# Patient Record
Sex: Male | Born: 1950
Health system: Southern US, Community
[De-identification: ages and names within clinical notes are randomized; demographics above are authoritative.]

## PROBLEM LIST (undated history)

## (undated) DIAGNOSIS — R7303 Prediabetes: Secondary | ICD-10-CM

## (undated) DIAGNOSIS — E7800 Pure hypercholesterolemia, unspecified: Secondary | ICD-10-CM

## (undated) DIAGNOSIS — E875 Hyperkalemia: Secondary | ICD-10-CM

## (undated) DIAGNOSIS — M722 Plantar fascial fibromatosis: Secondary | ICD-10-CM

## (undated) DIAGNOSIS — R972 Elevated prostate specific antigen [PSA]: Secondary | ICD-10-CM

## (undated) DIAGNOSIS — Z86010 Personal history of colonic polyps: Secondary | ICD-10-CM

## (undated) DIAGNOSIS — K222 Esophageal obstruction: Secondary | ICD-10-CM

## (undated) DIAGNOSIS — K5190 Ulcerative colitis, unspecified, without complications: Secondary | ICD-10-CM

## (undated) DIAGNOSIS — K449 Diaphragmatic hernia without obstruction or gangrene: Secondary | ICD-10-CM

## (undated) DIAGNOSIS — K529 Noninfective gastroenteritis and colitis, unspecified: Secondary | ICD-10-CM

## (undated) DIAGNOSIS — K5730 Diverticulosis of large intestine without perforation or abscess without bleeding: Secondary | ICD-10-CM

## (undated) DIAGNOSIS — R7301 Impaired fasting glucose: Secondary | ICD-10-CM

## (undated) DIAGNOSIS — K219 Gastro-esophageal reflux disease without esophagitis: Secondary | ICD-10-CM

## (undated) DIAGNOSIS — K5520 Angiodysplasia of colon without hemorrhage: Secondary | ICD-10-CM

## (undated) DIAGNOSIS — I10 Essential (primary) hypertension: Secondary | ICD-10-CM

## (undated) HISTORY — DX: Pure hypercholesterolemia, unspecified: E78.00

## (undated) HISTORY — DX: Personal history of colonic polyps: Z86.010

## (undated) HISTORY — PX: COLONOSCOPY: SHX174

## (undated) HISTORY — DX: Hyperkalemia: E87.5

## (undated) HISTORY — DX: Ulcerative colitis, unspecified, without complications: K51.90

## (undated) HISTORY — DX: Esophageal obstruction: K22.2

## (undated) HISTORY — DX: Impaired fasting glucose: R73.01

## (undated) HISTORY — DX: Diaphragmatic hernia without obstruction or gangrene: K44.9

## (undated) HISTORY — DX: Essential (primary) hypertension: I10

## (undated) HISTORY — DX: Noninfective gastroenteritis and colitis, unspecified: K52.9

## (undated) HISTORY — DX: Elevated prostate specific antigen (PSA): R97.20

## (undated) HISTORY — PX: VASECTOMY: SHX75

## (undated) HISTORY — PX: OTHER SURGICAL HISTORY: SHX169

## (undated) HISTORY — DX: Gastro-esophageal reflux disease without esophagitis: K21.9

## (undated) HISTORY — DX: Prediabetes: R73.03

## (undated) HISTORY — DX: Angiodysplasia of colon without hemorrhage: K55.20

## (undated) HISTORY — DX: Plantar fascial fibromatosis: M72.2

## (undated) HISTORY — PX: UPPER GASTROINTESTINAL ENDOSCOPY: SHX188

## (undated) HISTORY — DX: Diverticulosis of large intestine without perforation or abscess without bleeding: K57.30

---

## 1997-03-28 ENCOUNTER — Encounter: Payer: Self-pay | Admitting: Internal Medicine

## 2010-03-05 ENCOUNTER — Encounter (INDEPENDENT_AMBULATORY_CARE_PROVIDER_SITE_OTHER): Payer: Self-pay | Admitting: *Deleted

## 2010-03-05 ENCOUNTER — Ambulatory Visit
Admission: RE | Admit: 2010-03-05 | Discharge: 2010-03-05 | Payer: Self-pay | Source: Home / Self Care | Attending: Internal Medicine | Admitting: Internal Medicine

## 2010-03-05 DIAGNOSIS — E785 Hyperlipidemia, unspecified: Secondary | ICD-10-CM | POA: Insufficient documentation

## 2010-03-05 DIAGNOSIS — K219 Gastro-esophageal reflux disease without esophagitis: Secondary | ICD-10-CM | POA: Insufficient documentation

## 2010-03-05 DIAGNOSIS — K625 Hemorrhage of anus and rectum: Secondary | ICD-10-CM | POA: Insufficient documentation

## 2010-03-05 DIAGNOSIS — R1032 Left lower quadrant pain: Secondary | ICD-10-CM | POA: Insufficient documentation

## 2010-03-07 ENCOUNTER — Ambulatory Visit
Admission: RE | Admit: 2010-03-07 | Discharge: 2010-03-07 | Payer: Self-pay | Source: Home / Self Care | Attending: Gastroenterology | Admitting: Gastroenterology

## 2010-03-07 ENCOUNTER — Other Ambulatory Visit: Payer: Self-pay | Admitting: Gastroenterology

## 2010-03-07 LAB — CBC WITH DIFFERENTIAL/PLATELET
Basophils Absolute: 0 10*3/uL (ref 0.0–0.1)
Basophils Relative: 0.7 % (ref 0.0–3.0)
Eosinophils Absolute: 0.2 10*3/uL (ref 0.0–0.7)
Eosinophils Relative: 3.2 % (ref 0.0–5.0)
HCT: 42.8 % (ref 39.0–52.0)
Hemoglobin: 14.7 g/dL (ref 13.0–17.0)
Lymphocytes Relative: 35 % (ref 12.0–46.0)
Lymphs Abs: 2.3 10*3/uL (ref 0.7–4.0)
MCHC: 34.3 g/dL (ref 30.0–36.0)
MCV: 91 fl (ref 78.0–100.0)
Monocytes Absolute: 0.8 10*3/uL (ref 0.1–1.0)
Monocytes Relative: 11.6 % (ref 3.0–12.0)
Neutro Abs: 3.3 10*3/uL (ref 1.4–7.7)
Neutrophils Relative %: 49.5 % (ref 43.0–77.0)
Platelets: 324 10*3/uL (ref 150.0–400.0)
RBC: 4.7 Mil/uL (ref 4.22–5.81)
RDW: 13.8 % (ref 11.5–14.6)
WBC: 6.7 10*3/uL (ref 4.5–10.5)

## 2010-03-07 LAB — HEPATIC FUNCTION PANEL
ALT: 27 U/L (ref 0–53)
AST: 24 U/L (ref 0–37)
Albumin: 3.9 g/dL (ref 3.5–5.2)
Alkaline Phosphatase: 59 U/L (ref 39–117)
Bilirubin, Direct: 0.1 mg/dL (ref 0.0–0.3)
Total Bilirubin: 0.7 mg/dL (ref 0.3–1.2)
Total Protein: 7.3 g/dL (ref 6.0–8.3)

## 2010-03-07 LAB — BASIC METABOLIC PANEL
BUN: 15 mg/dL (ref 6–23)
CO2: 28 mEq/L (ref 19–32)
Calcium: 9.3 mg/dL (ref 8.4–10.5)
Chloride: 104 mEq/L (ref 96–112)
Creatinine, Ser: 0.9 mg/dL (ref 0.4–1.5)
GFR: 91.29 mL/min (ref >60.00)
Glucose, Bld: 91 mg/dL (ref 70–99)
Potassium: 4.5 mEq/L (ref 3.5–5.1)
Sodium: 141 mEq/L (ref 135–145)

## 2010-03-07 LAB — IBC PANEL
Iron: 106 ug/dL (ref 42–165)
Saturation Ratios: 29.6 % (ref 20.0–50.0)
Transferrin: 256.2 mg/dL (ref 212.0–360.0)

## 2010-03-07 LAB — HIGH SENSITIVITY CRP: CRP, High Sensitivity: 21.17 mg/L — ABNORMAL HIGH (ref 0.00–5.00)

## 2010-03-07 LAB — IRON: Iron: 106 ug/dL (ref 42–165)

## 2010-03-07 LAB — FERRITIN: Ferritin: 290.7 ng/mL (ref 22.0–322.0)

## 2010-03-07 LAB — FOLATE: Folate: 16.7 ng/mL

## 2010-03-07 LAB — VITAMIN B12: Vitamin B-12: 1093 pg/mL — ABNORMAL HIGH (ref 211–911)

## 2010-03-07 LAB — SEDIMENTATION RATE: Sed Rate: 47 mm/hr — ABNORMAL HIGH (ref 0–22)

## 2010-03-07 LAB — TSH: TSH: 1.61 u[IU]/mL (ref 0.35–5.50)

## 2010-03-18 ENCOUNTER — Ambulatory Visit
Admission: RE | Admit: 2010-03-18 | Discharge: 2010-03-18 | Payer: Self-pay | Source: Home / Self Care | Attending: Gastroenterology | Admitting: Gastroenterology

## 2010-03-18 ENCOUNTER — Encounter (INDEPENDENT_AMBULATORY_CARE_PROVIDER_SITE_OTHER): Payer: Self-pay | Admitting: *Deleted

## 2010-03-18 DIAGNOSIS — K5732 Diverticulitis of large intestine without perforation or abscess without bleeding: Secondary | ICD-10-CM | POA: Insufficient documentation

## 2010-03-18 DIAGNOSIS — K5730 Diverticulosis of large intestine without perforation or abscess without bleeding: Secondary | ICD-10-CM | POA: Insufficient documentation

## 2010-03-19 ENCOUNTER — Telehealth: Payer: Self-pay | Admitting: Gastroenterology

## 2010-04-03 NOTE — Assessment & Plan Note (Signed)
Summary: f/u diverticulitis/dsn   History of Present Illness Visit Type: Follow-up Visit Primary GI MD: Verl Blalock MD [place] Primary Provider: Unice Cobble, MD Requesting Provider: Unice Cobble, MD  Chief Complaint: Patient here for f/u after diverticulitis flare. He states that he is doing better. He does have some increased stomach "growling." He denies any abdominal pain or change in bowels at this time. History of Present Illness:   Stephen Bryant is 100% better after being treated for acute diverticulitis. He has had no further rectal bleeding. All labs were normal assessment elevated sed rate and CRP consistent with an acute inflammatory reaction. Other considerations would be resolved ischemic colitis.   GI Review of Systems      Denies abdominal pain, acid reflux, belching, bloating, chest pain, dysphagia with liquids, dysphagia with solids, heartburn, loss of appetite, nausea, vomiting, vomiting blood, weight loss, and  weight gain.      Reports diverticulosis.     Denies anal fissure, black tarry stools, change in bowel habit, constipation, diarrhea, fecal incontinence, heme positive stool, hemorrhoids, irritable bowel syndrome, jaundice, light color stool, liver problems, rectal bleeding, and  rectal pain. Preventive Screening-Counseling & Management  Caffeine-Diet-Exercise     Does Patient Exercise: no      Drug Use:  no.      Current Medications (verified): 1)  None  Allergies (verified): No Known Drug Allergies  Past History:  Past medical, surgical, family and social histories (including risk factors) reviewed for relevance to current acute and chronic problems.  Past Medical History: Microscopic Hematuria 1999 Hyperlipidemia : 1999 LDL 196, HDL 56, TC 285, TG 167. Framingham Study LDL goal = < 130 RECTAL BLEEDING (ICD-569.3) ABDOMINAL PAIN, LEFT LOWER QUADRANT (ICD-789.04) GERD (ICD-530.81) Diverticulosis Diverticulitis  Past Surgical  History: Reviewed history from 03/05/2010 and no changes required. Denies surgical history Colonoscopy : NONE  ( [place] reviewed)  Family History: Reviewed history from 03/07/2010 and no changes required. Father::heart disease,MI @ 69, DM Mother: deceased of stomach cancer Siblings: 1 brother: COPD, 1 sister: CAD, MI @ 73 No FH of Colon Cancer:  Social History: Reviewed history from 03/07/2010 and no changes required. Occupation: Armed forces technical officer Married Childern Former Smoker: quit 2005 Alcohol use-yes: 2 glasses of wine once daily or every other day  Daily Caffeine Use: 3 daily  Illicit Drug Use - no Patient does not get regular exercise.  Drug Use:  no Does Patient Exercise:  no  Review of Systems       The patient complains of hearing problems.  The patient denies allergy/sinus, anemia, anxiety-new, arthritis/joint pain, back pain, blood in urine, breast changes/lumps, change in vision, confusion, cough, coughing up blood, depression-new, fainting, fatigue, fever, headaches-new, heart murmur, heart rhythm changes, itching, menstrual pain, muscle pains/cramps, night sweats, nosebleeds, pregnancy symptoms, shortness of breath, skin rash, sleeping problems, sore throat, swelling of feet/legs, swollen lymph glands, thirst - excessive , urination - excessive , urination changes/pain, urine leakage, vision changes, and voice change.    Vital Signs:  Patient profile:   60 year old male Height:      69.25 inches Weight:      185.25 pounds BMI:     27.26 BSA:     2.01 Pulse rate:   72 / minute Pulse rhythm:   regular BP sitting:   126 / 80  (left arm)  Vitals Entered By: Madlyn Frankel CMA Deborra Medina) (March 18, 2010 8:19 AM)  Physical Exam  General:  Well developed, well nourished, no acute distress.healthy  appearing.   Head:  Normocephalic and atraumatic. Eyes:  PERRLA, no icterus.exam deferred to patient's ophthalmologist.   Abdomen:  Soft, nontender and nondistended. No  masses, hepatosplenomegaly or hernias noted. Normal bowel sounds.No left lower quadrant tenderness or mass noted. Rectal:  deferred until time of colonoscopy.   Psych:  Alert and cooperative. Normal mood and affect.   Impression & Recommendations:  Problem # 1:  DIVERTICULITIS, COLON (ICD-562.11) Assessment Improved Slowly reintroduce fiber into his diet. I also placed him on Florstar daily for several weeks. Colonoscopy scheduled for one months time. Patient education video on diverticular management shown to the patient.  Problem # 2:  RECTAL BLEEDING (ICD-569.3) Assessment: Improved Colonoscopy indicated to occlude ischemic colitis, inflammatory bowel disease, carcinoma etc.  Problem # 3:  GERD (ICD-530.81) Assessment: Improved He denies clinical symptomatology at this time.  Patient Instructions: 1)  Copy sent to :Dr. Unice Cobble 2)  Diverticular Disease brochure given.  3)  Colonoscopy and Flexible Sigmoidoscopy brochure given.  4)  Colonoscopy in one month's time.  Appended Document: Orders Update    Clinical Lists Changes  Medications: Added new medication of MOVIPREP 100 GM  SOLR (PEG-KCL-NACL-NASULF-NA ASC-C) As per prep instructions. - Signed Added new medication of FLORASTOR 250 MG CAPS (SACCHAROMYCES BOULARDII) take one by mouth once daily - Signed Rx of MOVIPREP 100 GM  SOLR (PEG-KCL-NACL-NASULF-NA ASC-C) As per prep instructions.;  #1 x 0;  Signed;  Entered by: Bernita Buffy CMA (AAMA);  Authorized by: Sable Feil MD [facility];  Method used: [phone] [address] [place], [place], [place]  [address], Ph: [phone] or [phone], Fax: [phone] Rx of FLORASTOR 250 MG CAPS (SACCHAROMYCES BOULARDII) take one by mouth once daily;  #30 x 3;  Signed;  Entered by: Bernita Buffy CMA (AAMA);  Authorized by: Sable Feil MD [facility];  Method used: [facility]  [address] [phone] [address] [place], [place], [place]  [address],  Ph: [phone] or [phone], Fax: [phone] Orders: Added new Test order of Colonoscopy (Colon) - Signed Observations: Added new observation of PI GASTRO: Copy sent to : Unice Cobble, MD [facility] Patient Information Guide given to patient.  Colonoscopy and Flexible Sigmoidoscopy brochure given.  Your procedure has been scheduled for 04/16/2010, please follow the seperate instructions.  Your prescription(s) have been sent to you pharmacy.  Today you watched the movie on diverticular disease.  The medication list was reviewed and reconciled.  All changed / newly prescribed medications were explained.  A complete medication list was provided to the patient / caregiver. (03/18/2010 8:40)    Prescriptions: FLORASTOR 250 MG CAPS (SACCHAROMYCES BOULARDII) take one by mouth once daily  #30 x 3   Entered by:   Bernita Buffy CMA ([place])   Authorized by:   Sable Feil MD [facility]   Signed by:   Bernita Buffy CMA ([place]) on 03/18/2010   Method used:   [facility]        [facility]  [phone] (retail)       [place], [place]  [address]       Ph: [phone] or [phone]       Fax: [phone]   RxID:   [id number] MOVIPREP 100 GM  SOLR (PEG-KCL-NACL-NASULF-NA ASC-C) As per prep instructions.  #1 x 0   Entered by:   Bernita Buffy CMA (  AAMA)   Authorized by:   Sable Feil MD [facility]   Signed by:   Bernita Buffy CMA ([place]) on 03/18/2010   Method used:   [facility]        [facility]  [phone][facility] (retail)       [place], [place]  [address]       Ph: [phone] or [phone]       Fax: [phone]   RxID:   [id number]    Patient Instructions: 1)  Copy sent to : Unice Cobble, MD 2)  [facility] Patient Information Guide given to patient.  3)  Colonoscopy and Flexible Sigmoidoscopy brochure given.  4)  Your procedure has been scheduled for 04/16/2010, please follow the  seperate instructions.  5)  Your prescription(s) have been sent to you pharmacy.  6)  Today you watched the movie on diverticular disease.  7)  The medication list was reviewed and reconciled.  All changed / newly prescribed medications were explained.  A complete medication list was provided to the patient / caregiver.

## 2010-04-03 NOTE — Assessment & Plan Note (Signed)
Summary: to reest//abd pain//blood in stool//lch   Vital Signs:  Patient profile:   60 year old male Height:      69.25 inches Weight:      185 pounds BMI:     27.22 Temp:     98.0 degrees F oral Pulse rate:   72 / minute Resp:     14 per minute BP sitting:   122 / 86  (left arm) Cuff size:   large  Vitals Entered By: Georgette Dover CMA (March 05, 2010 2:45 PM) CC: Re-Establish: blood in stool(dark red)  since Monday (off/on) , Abdominal pain, Lipid Management   CC:  Re-Establish: blood in stool(dark red)  since Monday (off/on) , Abdominal pain, and Lipid Management.  History of Present Illness:      This is a 60 year old man who presents with  persistent abdominal pain sonce 03/03/2010 after eating eve meal.  He passed a clot of blood  w/o stool. The patient denies nausea, diarrhea, constipation, melena, anorexia, and hematemesis.  The location of the pain is left lower quadrant.  The pain is described as constant, dull, cramping in quality, and non  radiating .  The patient denies the following symptoms: fever, weight loss, jaundice, and dark urine.  The pain is worse with jarring of the abdomen such as coughing .  The pain  has no relievers.He had a liquid stool with blood 01/03 am. His BM was "normal " today.  Lipid Management History:      Positive NCEP/ATP III risk factors include male age 25 years old or older and family history for ischemic heart disease (females less than 82 years old).  Negative NCEP/ATP III risk factors include non-diabetic, non-tobacco-user status, non-hypertensive, no ASHD (atherosclerotic heart disease), no prior stroke/TIA, no peripheral vascular disease, and no history of aortic aneurysm.     Preventive Screening-Counseling & Management  Alcohol-Tobacco     Smoking Status: quit  Current Medications (verified): 1)  None  Allergies (verified): No Known Drug Allergies  Past History:  Past Medical History: Microscopic Hematuria  1999 GERD Hyperlipidemia : 1999 LDL 196, HDL 56, TC 285, TG 167. Framingham Study LDL goal = < 130.  Past Surgical History: Denies surgical history Colonoscopy : NONE  ( [place] reviewed)  Family History: Father::heart disease,MI @ 79, DM Mother: deceasedof stomach cancer Siblings: 1 brother: COPD, 1 sister: CAD, MI @ 77  Social History: Occupation: Armed forces technical officer Married Former Smoker: quit 2005 Alcohol use-yes: 2 glasses of wine once daily or every other day  Smoking Status:  quit  Review of Systems ENT:  Denies difficulty swallowing, hoarseness, and nosebleeds. Resp:  Denies coughing up blood. GU:  Denies discharge and hematuria. Heme:  Denies abnormal bruising and bleeding.  Physical Exam  General:  well-nourished,in no acute distress; alert,appropriate and cooperative throughout examination Eyes:  No corneal or conjunctival inflammation noted.  Perrla. No icterus Mouth:  Oral mucosa and oropharynx without lesions or exudates.  Teeth in good repair. No pharyngeal erythema.   Lungs:  Normal respiratory effort, chest expands symmetrically. Lungs are clear to auscultation, no crackles or wheezes. Heart:  Normal rate and regular rhythm. S1 and S2 normal without gallop, murmur, click, rub .S4 Abdomen:  Bowel sounds positive,abdomen soft  but slightly tender LQ  without masses, organomegaly or hernias noted. Rectal:  No external abnormalities noted. Normal sphincter tone. No rectal masses or tenderness. FOB negative but no visable stool Prostate:  Prostate gland firm and smooth, no enlargement,  nodularity, tenderness, mass, asymmetry or induration. Skin:  Intact without suspicious lesions or rashes. No jaundice Cervical Nodes:  No lymphadenopathy noted Axillary Nodes:  No palpable lymphadenopathy Psych:  memory intact for recent and remote, normally interactive, and good eye contact.     Impression & Recommendations:  Problem # 1:  ABDOMINAL PAIN, LEFT LOWER QUADRANT  (ICD-789.04)  Orders: Venipuncture (44920) TLB-CBC Platelet - w/Differential (85025-CBCD) Gastroenterology Referral (GI)  Problem # 2:  RECTAL BLEEDING (ICD-569.3)  Orders: Venipuncture (10071) TLB-CBC Platelet - w/Differential (85025-CBCD) Gastroenterology Referral (GI)  Problem # 3:  HYPERLIPIDEMIA (ICD-272.4)  Problem # 4:  GERD (ICD-530.81)  His updated medication list for this problem includes:    Oscimin 0.125 Mg Subl (Hyoscyamine sulfate) .Marland Kitchen... 1 under tongue every 6 hrs as needed for pain  Complete Medication List: 1)  Oscimin 0.125 Mg Subl (Hyoscyamine sulfate) .Marland Kitchen.. 1 under tongue every 6 hrs as needed for pain  Lipid Assessment/Plan:      Based on NCEP/ATP III, the patient's risk factor category is "2 or more risk factors and a calculated 10 year CAD risk of > 20%".  The patient's lipid goals are as follows: Total cholesterol goal is 200; LDL cholesterol goal is 160; HDL cholesterol goal is 40; Triglyceride goal is 150.    Patient Instructions: 1)  Complete stool cards. Once GI issues are addressed ,please have a fasting [place] Heart Panel (1304X) to optimally assess risk of high cholesterol.(Code: 272.4, V17.3) Prescriptions: OSCIMIN 0.125 MG SUBL (HYOSCYAMINE SULFATE) 1 under tongue every 6 hrs as needed for pain  #30 x 0   Entered and Authorized by:   Unice Cobble MD   Signed by:   Unice Cobble MD on 03/05/2010   Method used:   Print then Give to Patient   RxID:   [id number]    Orders Added: 1)  Est. Patient Level IV [15830] 2)  Venipuncture [94076] 3)  TLB-CBC Platelet - w/Differential [85025-CBCD] 4)  Gastroenterology Referral [GI]

## 2010-04-03 NOTE — Progress Notes (Signed)
Summary: triage  Phone Note Call from Patient Call back at [phone]   Caller: Patient Call For: Dr Sharlett Iles Reason for Call: Talk to Nurse Summary of Call: Patient states that he woke up with a lot of pain on his lowr abd, patient was seen yesterday but wants to know what to do. Patient is scheduled fo a procedure 2-15. Initial call taken by: Ronalee Red,  March 19, 2010 10:26 AM  Follow-up for Phone Call        Lmom for patient to call back with s&s. Shella Maxim RN  March 19, 2010 10:45 AM   Patient was seen yesterday and denied abdominal pain. Patient reports he woke up this am to left side pain he rates a 4 on 0-10 scale. Patient took 1 Florastar yesterday. He reports 1 kind of watery BM today- no blood, but he is having "urges" like he needs to go. Shella Maxim RN  March 19, 2010 10:53 AM   Additional Follow-up for Phone Call Additional follow up Details #1::        resume metronidazole 500 mg twice a day and Cipro 500 mg twice a day with p.r.n. Ultram 50 mg every 6-8 hours for 10 days. Also low fiber diet and p.r.n. sublingual Levsin. Additional Follow-up by: Sable Feil MD Philipp Deputy 1:12 PM    Additional Follow-up for Phone Call Additional follow up Details #2::    Notified patient of Dr Buel Ream scripts and the order for a low fiber diet- patient stated understanding. Follow-up by: Shella Maxim RN,  March 19, 2010 2:12 PM  New/Updated Medications: CIPROFLOXACIN HCL 500 MG  TABS (CIPROFLOXACIN HCL) Take 1 twice a day times 10 days METRONIDAZOLE 500 MG  TABS (METRONIDAZOLE) Take 1 twice a day times 10 days TRAMADOL HCL 50 MG TABS (TRAMADOL HCL) 1 by mouth q6-8hr as needed for abdominal pain LEVSIN/SL 0.125 MG  SUBL (HYOSCYAMINE SULFATE) 1 SL as needed for abdominal pain or spasms Prescriptions: LEVSIN/SL 0.125 MG  SUBL (HYOSCYAMINE SULFATE) 1 SL as needed for abdominal pain or spasms  #30 x 0   Entered by:   Shella Maxim  RN   Authorized by:   Sable Feil MD [facility]   Signed by:   Shella Maxim RN on 03/19/2010   Method used:   [facility]        [facility]  [address] [phone][facility] (retail)       [place], [place]  [address]       Ph: [phone] or [phone]       Fax: [phone]   RxID:   [id number] TRAMADOL HCL 50 MG TABS (TRAMADOL HCL) 1 by mouth q6-8hr as needed for abdominal pain  #30 x 0   Entered by:   Shella Maxim RN   Authorized by:   Sable Feil MD [facility]   Signed by:   Shella Maxim RN on 03/19/2010   Method used:   [facility]ronically to        [facility]  [address] [phone]* (retail)       [place], [place]  [address]       Ph: [phone] or [phone]       Fax: [phone]   RxID:   [id number] METRONIDAZOLE 500 MG  TABS (METRONIDAZOLE) Take 1 twice a  day times 10 days  #20 x 0   Entered by:   Shella Maxim RN   Authorized by:   Sable Feil MD [facility]   Signed by:   Shella Maxim RN on 03/19/2010   Method used:   Electronically to        [facility]  [address] [phone]* (retail)       [place], [place]  [address]       Ph: [phone] or [phone]       Fax: [phone]   RxID:   [id number] CIPROFLOXACIN HCL 500 MG  TABS (CIPROFLOXACIN HCL) Take 1 twice a day times 10 days  #20 x 0   Entered by:   Shella Maxim RN   Authorized by:   Sable Feil MD [facility]   Signed by:   Shella Maxim RN on 03/19/2010   Method used:   Electronically to        [facility]  [address] [phone]* (retail)       [place], [place]  [address]       Ph: [phone] or [phone]       Fax: [phone]   RxID:   [id number]

## 2010-04-03 NOTE — Letter (Signed)
Summary: [facility] Instructions  [facility]  [place], [place] [address]   Phone: [phone]  Fax: [phone]       Stephen Bryant    1950-03-21    MRN: [id number]        Procedure Day Sudie Grumbling: Wednesday 04/16/2010     Arrival Time: 9:30am     Procedure Time: 10:30am     Location of Procedure:                    X  [facility] [place] [address] (4th Floor)                        [place]   Starting 5 days prior to your procedure 04/11/2010 do not eat nuts, seeds, popcorn, corn, beans, peas,  salads, or any raw vegetables.  Do not take any fiber supplements (e.g. Metamucil, Citrucel, and Benefiber).  THE DAY BEFORE YOUR PROCEDURE         Tuesday 04/15/2010  1.  Drink clear liquids the entire day-NO SOLID FOOD  2.  Do not drink anything colored red or purple.  Avoid juices with pulp.  No orange juice.  3.  Drink at least 64 oz. (8 glasses) of fluid/clear liquids during the day to prevent dehydration and help the prep work efficiently.  CLEAR LIQUIDS INCLUDE: Water Jello Ice Popsicles Tea (sugar ok, no milk/cream) Powdered fruit flavored drinks Coffee (sugar ok, no milk/cream) Gatorade Juice: apple, white grape, white cranberry  Lemonade Clear bullion, consomm, broth Carbonated beverages (any kind) Strained chicken noodle soup Hard Candy                             4.  In the morning, mix first dose of MoviPrep solution:    Empty 1 Pouch A and 1 Pouch B into the disposable container    Add lukewarm drinking water to the top line of the container. Mix to dissolve    Refrigerate (mixed solution should be used within 24 hrs)  5.  Begin drinking the prep at 5:00 p.m. The MoviPrep container is divided by 4 marks.   Every 15 minutes drink the solution down to the next mark (approximately 8 oz) until the full liter is complete.   6.  Follow completed prep with 16 oz of clear liquid of your choice (Nothing red or  purple).  Continue to drink clear liquids until bedtime.  7.  Before going to bed, mix second dose of MoviPrep solution:    Empty 1 Pouch A and 1 Pouch B into the disposable container    Add lukewarm drinking water to the top line of the container. Mix to dissolve    Refrigerate  THE DAY OF YOUR PROCEDURE      Wednesday 04/16/2010  Beginning at 5:30am (5 hours before procedure):         1. Every 15 minutes, drink the solution down to the next mark (approx 8 oz) until the full liter is complete.  2. Follow completed prep with 16 oz. of clear liquid of your choice.    3. You may drink clear liquids until 8:30am (2 HOURS BEFORE PROCEDURE).   MEDICATION INSTRUCTIONS  Unless otherwise instructed, you should take regular prescription medications with a small sip of water   as early as possible the morning of your procedure.  OTHER INSTRUCTIONS  You will need a responsible adult at least 61 years of age to accompany you and drive you home.   This person must remain in the waiting room during your procedure.  Wear loose fitting clothing that is easily removed.  Leave jewelry and other valuables at home.  However, you may wish to bring a book to read or  an iPod/MP3 player to listen to music as you wait for your procedure to start.  Remove all body piercing jewelry and leave at home.  Total time from sign-in until discharge is approximately 2-3 hours.  You should go home directly after your procedure and rest.  You can resume normal activities the  day after your procedure.  The day of your procedure you should not:   Drive   Make legal decisions   Operate machinery   Drink alcohol   Return to work  You will receive specific instructions about eating, activities and medications before you leave.    The above instructions have been reviewed and explained to me by   _______________________    I fully understand and can verbalize these instructions  _____________________________ Date _________

## 2010-04-03 NOTE — Letter (Signed)
Summary: New Patient letter  [facility]  [address] [place], [place] [address]   Phone: [phone]  Fax: [phone]       03/05/2010 MRN: [id number]  Stephen Bryant [place], [place]  [address]  Dear Mr. LEVI,  Welcome to the Gastroenterology Division at [facility].    You are scheduled to see Dr.  Verl Blalock on March 07, 2010 at 9:00am on the 3rd floor at [facility] [place] [facility].  We ask that you try to arrive at our office 15 minutes prior to your appointment time to allow for check-in.  We would like you to complete the enclosed self-administered evaluation form prior to your visit and bring it with you on the day of your appointment.  We will review it with you.  Also, please bring a complete list of all your medications or, if you prefer, bring the medication bottles and we will list them.  Please bring your insurance card so that we may make a copy of it.  If your insurance requires a referral to see a specialist, please bring your referral form from your primary care physician.  Co-payments are due at the time of your visit and may be paid by cash, check or credit card.     Your office visit will consist of a consult with your physician (includes a physical exam), any laboratory testing he/she may order, scheduling of any necessary diagnostic testing (e.g. x-ray, ultrasound, CT-scan), and scheduling of a procedure (e.g. Endoscopy, Colonoscopy) if required.  Please allow enough time on your schedule to allow for any/all of these possibilities.    If you cannot keep your appointment, please call [phone] to cancel or reschedule prior to your appointment date.  This allows [place] the opportunity to schedule an appointment for another patient in need of care.  If you do not cancel or reschedule by 5 p.m. the business day prior to your appointment date, you will be charged a $50.00 late cancellation/no-show fee.    Thank you  for choosing [facility] for your medical needs.  We appreciate the opportunity to care for you.  Please visit [place] at our website  to learn more about our practice.                     Sincerely,                                                             The Gastroenterology Division

## 2010-04-03 NOTE — Assessment & Plan Note (Signed)
Summary: LLQ ABD PAIN, RECTAL BLEEDING...EM   History of Present Illness Visit Type: Initial Consult Primary GI MD: Verl Blalock MD [place] Primary Provider: Unice Cobble, MD  Requesting Provider: Unice Cobble, MD  Chief Complaint: LLQ abd pain, and pt states after BMs clumps of BRB will come out  History of Present Illness:   Pleasant 60 year old white male referred by Dr. Unice Cobble for evaluation of one week of left lower quadrant discomfort with initial diarrhea and rectal bleeding which seems to have resolved.  Teddrick has no history of chronic medical problems or gastrointestinal issues. 5 days ago he developed sudden left lower quadrant discomfort, tenesmus, and passage of a blood clot with subsequent loose watery nonbloody diarrhea for 24 hours. His stools are now normal but he continues with left lower quadrant discomfort, but no fever, chills, nausea vomiting, upper gastrointestinal, or genitourinary complaints. He had been placed on p.r.n. Levsin which is used on several occasions with mild relief of his spasmodic pain.  2 weeks ago he took an unknown antibiotic that had been prescribed by his daughter for an eye infection. Otherwise, has had no new medications, decongestants, history of cardiovascular or peripheral vascular disease, abdominal trauma, or changes in diet. He currently feels well except the left lower quadrant discomfort which persists. He has not had previous gastrointestinal barium studies or colonoscopy exam.   GI Review of Systems    Reports abdominal pain, acid reflux, and  heartburn.     Location of  Abdominal pain: LLQ.    Denies belching, bloating, chest pain, dysphagia with liquids, dysphagia with solids, loss of appetite, nausea, vomiting, vomiting blood, weight loss, and  weight gain.      Reports rectal bleeding.     Denies anal fissure, black tarry stools, change in bowel habit, constipation, diarrhea, diverticulosis, fecal incontinence, heme  positive stool, hemorrhoids, irritable bowel syndrome, jaundice, light color stool, liver problems, and  rectal pain.    Current Medications (verified): 1)  Oscimin 0.125 Mg Subl (Hyoscyamine Sulfate) .Marland Kitchen.. 1 Under Tongue Every 6 Hrs As Needed For Pain  Allergies (verified): No Known Drug Allergies  Past History:  Past medical, surgical, family and social histories (including risk factors) reviewed for relevance to current acute and chronic problems.  Past Medical History: Microscopic Hematuria 1999 Hyperlipidemia : 1999 LDL 196, HDL 56, TC 285, TG 167. Framingham Study LDL goal = < 130 RECTAL BLEEDING (ICD-569.3) ABDOMINAL PAIN, LEFT LOWER QUADRANT (ICD-789.04) GERD (ICD-530.81)  Past Surgical History: Reviewed history from 03/05/2010 and no changes required. Denies surgical history Colonoscopy : NONE  ( [place] reviewed)  Family History: Reviewed history from 03/05/2010 and no changes required. Father::heart disease,MI @ 21, DM Mother: deceased of stomach cancer Siblings: 1 brother: COPD, 1 sister: CAD, MI @ 19 No FH of Colon Cancer:  Social History: Reviewed history from 03/05/2010 and no changes required. Occupation: Armed forces technical officer Married Childern Former Smoker: quit 2005 Alcohol use-yes: 2 glasses of wine once daily or every other day  Daily Caffeine Use: 3 daily   Review of Systems       The patient complains of hearing problems.  The patient denies allergy/sinus, anemia, anxiety-new, arthritis/joint pain, back pain, blood in urine, breast changes/lumps, change in vision, confusion, cough, coughing up blood, depression-new, fainting, fatigue, fever, headaches-new, heart murmur, heart rhythm changes, itching, menstrual pain, muscle pains/cramps, night sweats, nosebleeds, pregnancy symptoms, shortness of breath, skin rash, sleeping problems, sore throat, swelling of feet/legs, swollen lymph glands, thirst - excessive, urination -  excessive, urination changes/pain, urine  leakage, vision changes, and voice change.    Vital Signs:  Patient profile:   60 year old male Height:      69.25 inches Weight:      185 pounds BMI:     27.22 BSA:     2.01 Pulse rate:   74 / minute Pulse rhythm:   regular BP sitting:   132 / 76  (left arm) Cuff size:   regular  Vitals Entered By: Hope Pigeon CMA (March 07, 2010 8:32 AM)  Physical Exam  General:  Well developed, well nourished, no acute distress.healthy appearing.   Head:  Normocephalic and atraumatic. Eyes:  PERRLA, no icterus.exam deferred to patient's ophthalmologist.   Neck:  Supple; no masses or thyromegaly. Lungs:  Clear throughout to auscultation. Heart:  Regular rate and rhythm; no murmurs, rubs,  or bruits. Abdomen:  No organomegaly noted or evidence of distention. There is mild tenderness in the left lower quadrant of the sigmoid colon without rebound tenderness. Bowel sounds are entirely normal. Rectal:  Normal exam.soft stool present which is guaiac negative. Prostate:  .normal size prostate.   Msk:  Symmetrical with no gross deformities. Normal posture. Pulses:  Normal pulses noted. Extremities:  No clubbing, cyanosis, edema or deformities noted. Neurologic:  Alert and  oriented x4;  grossly normal neurologically. Cervical Nodes:  No significant cervical adenopathy. Psych:  Alert and cooperative. Normal mood and affect.   Impression & Recommendations:  Problem # 1:  ABDOMINAL PAIN, LEFT LOWER QUADRANT (ICD-789.04) Assessment Unchanged Probable subacute diverticulitis. It is also possible that he has had transient ischemic colitis, but he has no real risk factors for this process, use a vasoconstrictive medications, history of cardiovascular or peripheral vascular disease et Ronney Asters. Other considerations would be C. difficile infection related to previous antibiotic use, bacterial enterocolitis, versus idiopathic inflammatory bowel disease. I have decided to place him on metronidazole 500 mg  twice a day, Cipro 500 mg twice a day, p.r.n. Levsin, low fiber diet, and we'll check labs including stool culture and stool C. difficile exam. He is return for followup in 10 days' time. He will need colonoscopy depending on his clinical course. If his symptoms worsen, he is to call us and we'll proceed with abdominal-pelvic CT scan. Orders: TLB-BMP (Basic Metabolic Panel-BMET) ([id number]) TLB-Hepatic/Liver Function Pnl (80076-HEPATIC) TLB-CBC Platelet - w/Differential (85025-CBCD) TLB-TSH (Thyroid Stimulating Hormone) (84443-TSH) TLB-B12, Serum-Total ONLY (74163-A45) TLB-Ferritin (36468-EHO) TLB-Folic Acid (Folate) (12248-GNO) TLB-Iron, (Fe) Total (83540-FE) TLB-IBC Pnl (Iron/FE;Transferrin) (83550-IBC) TLB-CRP-High Sensitivity (C-Reactive Protein) (86140-FCRP) TLB-Sedimentation Rate (ESR) (85652-ESR) T-C diff by PCR (03704)  Problem # 2:  RECTAL BLEEDING (ICD-569.3) Assessment: Improved see above assessment. He will need colonoscopy in the near future. Orders: TLB-BMP (Basic Metabolic Panel-BMET) ([id number]) TLB-Hepatic/Liver Function Pnl (80076-HEPATIC) TLB-CBC Platelet - w/Differential (85025-CBCD) TLB-TSH (Thyroid Stimulating Hormone) (84443-TSH) TLB-B12, Serum-Total ONLY (88280-K34) TLB-Ferritin (91791-TAV) TLB-Folic Acid (Folate) (69794-IAX) TLB-Iron, (Fe) Total (83540-FE) TLB-IBC Pnl (Iron/FE;Transferrin) (83550-IBC) TLB-CRP-High Sensitivity (C-Reactive Protein) (86140-FCRP) TLB-Sedimentation Rate (ESR) (85652-ESR) T-C diff by PCR (65537)  Problem # 3:  GERD (ICD-530.81) Assessment: Improved p.r.n. acid reflux managed with dietary maneuvers.  Patient Instructions: 1)  Your physician requests that you go to the basement floor of our office to have the following labwork completed before leaving today: [place] Panel, Anemia Panel, CRP, Sedimentation Rate, C.Diff 2)  Please pick up your prescriptions at the pharmacy. Electronic prescription(s) has already been sent  for Cipro 500 mg two times a day x 10 days, Metronidazole 500 mg two  times a day x 10 days. 3)  We have given you a low residue/low fiber diet to follow. 4)  We have also given you a handout on Diverticulosis to read over. 5)  You have been scheduled for a follow-up appointment on 03/18/10 @ 8:30 am 6)  Copy sent to : Unice Cobble 7)  The medication list was reviewed and reconciled.  All changed / newly prescribed medications were explained.  A complete medication list was provided to the patient / caregiver. Prescriptions: METRONIDAZOLE 500 MG TABS (METRONIDAZOLE) Take 1 tablet by mouth two times a day x 10 days  #20 x 0   Entered by:   Madlyn Frankel CMA (AAMA)   Authorized by:   Sable Feil MD [facility]   Signed by:   Madlyn Frankel CMA (AAMA) on 03/07/2010   Method used:   [facility]  [address] [phone]ail[facility]       [place], [place]  [address]       Ph: [phone] or [phone]       Fax: [phone]   RxID:   [id number] CIPROFLOXACIN HCL 500 MG TABS (CIPROFLOXACIN HCL) Take 1 tablet by mouth two times a day x 10 days  #20 x 0   Entered by:   Madlyn Frankel CMA ([place])   Authorized by:   Sable Feil MD [facility]   Signed by:   Madlyn Frankel [place] ([place]) on 03/07/2010   Method used:   [facility]        [facility]  [address] [phone][facility] (retail[facility]       [place], [place]  [address]       Ph: [phone] or [phone]       Fax: [phone]   RxID:   [id number]

## 2010-04-16 ENCOUNTER — Other Ambulatory Visit: Payer: Self-pay | Admitting: Gastroenterology

## 2010-04-16 ENCOUNTER — Other Ambulatory Visit (AMBULATORY_SURGERY_CENTER): Payer: BC Managed Care – PPO | Admitting: Gastroenterology

## 2010-04-16 ENCOUNTER — Encounter: Payer: Self-pay | Admitting: Gastroenterology

## 2010-04-16 DIAGNOSIS — Z860100 Personal history of colon polyps, unspecified: Secondary | ICD-10-CM

## 2010-04-16 DIAGNOSIS — R109 Unspecified abdominal pain: Secondary | ICD-10-CM

## 2010-04-16 DIAGNOSIS — D126 Benign neoplasm of colon, unspecified: Secondary | ICD-10-CM

## 2010-04-16 DIAGNOSIS — K5730 Diverticulosis of large intestine without perforation or abscess without bleeding: Secondary | ICD-10-CM

## 2010-04-16 DIAGNOSIS — K5289 Other specified noninfective gastroenteritis and colitis: Secondary | ICD-10-CM

## 2010-04-16 DIAGNOSIS — K625 Hemorrhage of anus and rectum: Secondary | ICD-10-CM

## 2010-04-16 HISTORY — DX: Personal history of colonic polyps: Z86.010

## 2010-04-16 HISTORY — DX: Personal history of colon polyps, unspecified: Z86.0100

## 2010-04-22 ENCOUNTER — Encounter: Payer: Self-pay | Admitting: Gastroenterology

## 2010-04-23 NOTE — Miscellaneous (Signed)
Summary: Lialda Rx  Dr Sharlett Iles requested that lialda samples be sent upstairs for patient. He also requested that 1 years worth of lialda, 2 tablets by mouth once daily be sent to pharmacy for patient. Patients family requests that prescription be sent to [facility]. Prescription has been sent and samples have been provided. Dottie Nelson-Smith CMA (AAMA)  April 16, 2010 2:18 PM  Clinical Lists Changes  Medications: Added new medication of LIALDA 1.2 GM TBEC (MESALAMINE) Take 2 tablets by mouth once daily - Signed Changed medication from LIALDA 1.2 GM TBEC (MESALAMINE) Take 2 tablets by mouth once daily to LIALDA 1.2 GM TBEC (MESALAMINE) Take 2 tablets by mouth once daily Rx of LIALDA 1.2 GM TBEC (MESALAMINE) Take 2 tablets by mouth once daily;  #60 x 10;  Signed;  Entered by: Madlyn Frankel CMA (AAMA);  Authorized by: Sable Feil MD [facility];  Method used: Electronically to [facility] [address] [place], [place]  [id number], Ph: [phone], Fax: [phone]    Prescriptions: LIALDA 1.2 GM TBEC (MESALAMINE) Take 2 tablets by mouth once daily  #60 x 10   Entered by:   Madlyn Frankel CMA ([place])   Authorized by:   Sable Feil MD [facility]   Signed by:   [place] ([place]) on 04/16/2010   Method used:   Electronically to        [facility] (retail)       [place], [place]  [id number]       Ph: [phone]       Fax: [phone]   RxID:   [id number]

## 2010-04-23 NOTE — Procedures (Addendum)
Summary: Colonoscopy  Patient: Stephen Bryant Note: All result statuses are Final unless otherwise noted.  Tests: (1) Colonoscopy (COL)   COL Colonoscopy           [facility]     [place], [place]  [address]           COLONOSCOPY PROCEDURE REPORT           PATIENT:  Stephen, Bryant  MR#:  [id number]     BIRTHDATE:  07-07-1950, 59 yrs. old  GENDER:  male     ENDOSCOPIST:  Loralee Pacas. Sharlett Iles, MD, [facility]     REF. BY:  Unice Cobble, M.D.     PROCEDURE DATE:  04/16/2010     PROCEDURE:  Colonoscopy with snare polypectomy     ASA CLASS:  Class I     INDICATIONS:  Abdominal pain, Colorectal cancer screening, average     risk, rectal bleeding     MEDICATIONS:   Fentanyl 100 mcg IV, Versed 10 mg IV           DESCRIPTION OF PROCEDURE:   After the risks benefits and     alternatives of the procedure were thoroughly explained, informed     consent was obtained.  Digital rectal exam was performed and     revealed no abnormalities.   The LB 180AL [id number] endoscope was     introduced through the anus and advanced to the cecum, which was     identified by both the appendix and ileocecal valve, without     limitations.  The quality of the prep was excellent, using     MoviPrep.  The instrument was then slowly withdrawn as the colon     was fully examined.     <<PROCEDUREIMAGES>>           FINDINGS:  ULTRASONIC FINDINGS:   Two polyps were found.  LARGE     PEDUNCULATED VASCULAR SIGMOID POLYPS HOT SNARE EXCISED.SEE     PICTURES. Colitis was found in the sigmoid colon. RED,THICKENED     HAUSTRAL FOLDS IN AREA OF SEVERE DIVETICULOSIS.SEE PICTURES.     Severe diverticulosis was found in the sigmoid to descending colon     segments.   Retroflexed views in the rectum revealed no     abnormalities.    The scope was then withdrawn from the patient     and the procedure completed.           COMPLICATIONS:  None     ENDOSCOPIC IMPRESSION:     1) Colitis in the sigmoid  colon     2) Two polyps     3) Severe diverticulosis in the sigmoid to descending colon     segments     1.SEGMENTAL COLITIS SEEN WITH SEVERE TICS DISEASE     2.LARGE ADENOMAS.R/O ATYPIA.     RECOMMENDATIONS:     1.LIALDA 1.2 GM,2 PO DAILY     2.F/U 3 YEARS     3.HIGH FIBER DIET AND DAILY BENEFIBER     REPEAT EXAM:  No           ______________________________     Loralee Pacas. Sharlett Iles, MD, [facility]           CC:           n.     eSIGNED:   Loralee Pacas. Patterson at 04/16/2010 11:21 AM  [place], [place], [phone]  Note: An exclamation mark (!) indicates a result that was not dispersed into the flowsheet. Document Creation Date: 04/16/2010 11:22 AM _______________________________________________________________________  (1) Order result status: Final Collection or observation date-time: 04/16/2010 11:10 Requested date-time:  Receipt date-time:  Reported date-time:  Referring Physician:   Ordering Physician: Verl Blalock [phone]) Specimen Source:  Source: Tawanna Cooler Order Number: [id number] Lab site:   Appended Document: Colonoscopy     Procedures Next Due Date:    Colonoscopy: 04/2013

## 2010-04-29 NOTE — Letter (Signed)
Summary: Patient Notice- Polyp Results  [facility]  [address] [place], [place] [address]   Phone: [phone]  Fax: [phone]        April 22, 2010 MRN: [id number]    Stephen Bryant [address] [place],   [address]    Dear [name],  I am pleased to inform you that the colon polyp(s) removed during your recent colonoscopy was (were) found to be benign (no cancer detected) upon pathologic examination.  I recommend you have a repeat colonoscopy examination in 3_ years to look for recurrent polyps, as having colon polyps increases your risk for having recurrent polyps or even colon cancer in the future.  Should you develop new or worsening symptoms of abdominal pain, bowel habit changes or bleeding from the rectum or bowels, please schedule an evaluation with either your primary care physician or with me.  Additional information/recommendations:  __ No further action with gastroenterology is needed at this time. Please      follow-up with your primary care physician for your other healthcare      needs.  __ Please call [phone] to schedule a return visit to review your      situation.  __ Please keep your follow-up visit as already scheduled.  xx__ Continue treatment plan as outlined the day of your exam.  Please call us if you are having persistent problems or have questions about your condition that have not been fully answered at this time.  Sincerely,  Sable Feil MD [facility]  This letter has been electronically signed by your physician.  Appended Document: Patient Notice- Polyp Results letter mailed

## 2010-05-20 ENCOUNTER — Ambulatory Visit: Payer: BC Managed Care – PPO | Admitting: Gastroenterology

## 2010-06-27 ENCOUNTER — Telehealth: Payer: Self-pay | Admitting: Gastroenterology

## 2010-06-27 MED ORDER — CIPROFLOXACIN HCL 500 MG PO TABS: 500.0000 mg | ORAL_TABLET | Freq: Two times a day (BID) | ORAL | Status: AC

## 2010-06-27 NOTE — Telephone Encounter (Signed)
Pt scheduled for CT scan at [facility] 07/01/10 @11am . Pt to drink one bottle of contrast at 9am and one bottle at 10am. Pt to come by here on Monday and pick up the contrast.

## 2010-06-27 NOTE — Telephone Encounter (Signed)
coipro 574m bid...needs Ct scan also

## 2010-06-27 NOTE — Telephone Encounter (Signed)
Pt aware of Dr. Buel Ream recommendations.

## 2010-06-27 NOTE — Telephone Encounter (Signed)
Pt states that he has stopped taking his Lialda. Let pt know he was supposed to continue this medication-he has a prescription for a years worth of the [place]. Pt states he is having LLQ pain and wants to know if he needs an antibiotic or anything else. Dr. Sharlett Iles please advise.

## 2010-07-01 ENCOUNTER — Ambulatory Visit (INDEPENDENT_AMBULATORY_CARE_PROVIDER_SITE_OTHER)
Admission: RE | Admit: 2010-07-01 | Discharge: 2010-07-01 | Disposition: A | Discharge status: Home / Self Care | Payer: BC Managed Care – PPO | Source: Ambulatory Visit | Attending: Gastroenterology | Admitting: Gastroenterology

## 2010-07-01 DIAGNOSIS — R109 Unspecified abdominal pain: Secondary | ICD-10-CM

## 2010-07-01 MED ORDER — IOHEXOL 300 MG/ML  SOLN
100.0000 mL | Freq: Once | INTRAMUSCULAR | Status: AC | PRN
  Administered 2010-07-01: 100 mL via INTRAVENOUS

## 2010-07-02 ENCOUNTER — Telehealth: Payer: Self-pay | Admitting: *Deleted

## 2010-07-02 NOTE — Telephone Encounter (Signed)
Message copied by Bernita Buffy on Wed Jul 02, 2010  3:08 PM ------      Message from: Sharlett Iles, DAVID      Created: Wed Jul 02, 2010  8:30 AM       Please place him on Lialda 2.4 g a day and see me in 4 office visit.

## 2010-07-02 NOTE — Telephone Encounter (Signed)
Pt aware and is already on Lialda appt made foro 07/18/2010

## 2010-07-18 ENCOUNTER — Ambulatory Visit (INDEPENDENT_AMBULATORY_CARE_PROVIDER_SITE_OTHER): Payer: BC Managed Care – PPO | Admitting: Gastroenterology

## 2010-07-18 ENCOUNTER — Encounter: Payer: Self-pay | Admitting: Gastroenterology

## 2010-07-18 VITALS — BP 132/76 | HR 88 | Ht 68.0 in | Wt 183.0 lb

## 2010-07-18 DIAGNOSIS — R1032 Left lower quadrant pain: Secondary | ICD-10-CM | POA: Insufficient documentation

## 2010-07-18 DIAGNOSIS — K5289 Other specified noninfective gastroenteritis and colitis: Secondary | ICD-10-CM

## 2010-07-18 DIAGNOSIS — K529 Noninfective gastroenteritis and colitis, unspecified: Secondary | ICD-10-CM

## 2010-07-18 DIAGNOSIS — K5732 Diverticulitis of large intestine without perforation or abscess without bleeding: Secondary | ICD-10-CM | POA: Insufficient documentation

## 2010-07-18 MED ORDER — HYOSCYAMINE SULFATE 0.125 MG SL SUBL: 0.1250 mg | SUBLINGUAL_TABLET | SUBLINGUAL | Status: DC | PRN

## 2010-07-18 MED ORDER — MESALAMINE 1.2 G PO TBEC: DELAYED_RELEASE_TABLET | ORAL | Status: DC

## 2010-07-18 NOTE — Patient Instructions (Signed)
Today you watched the diverticulitis movie and were given a handout on this.  Your prescription(s) have been sent to you pharmacy.

## 2010-07-18 NOTE — Progress Notes (Signed)
This is a very pleasant 60 year old Caucasian male with recurrent left lower quadrant pain, documented diverticulitis, and colonoscopy which showed segmental colitis. He said 2 episodes of diverticulitis in the last 6 months requiring antibiotic therapy. He is supposed to be on Lialda 2.4 g a day, but there has been some mixup in his vacation said he has not been taking this med. He currently is asymptomatic and denies abdominal pain or bowel or irregularity or rectal bleeding or any systemic complaints. Recent CT scan otherwise was unremarkable but there was coronary artery calcification, hepatic hemangioma, and multiple small hepatic cyst.  Current Medications, Allergies, Past Medical History, Past Surgical History, Family History and Social History were reviewed in [employer] record.  Pertinent Review of Systems Negative,, primary care physician Dr. Unice Cobble. The patient has no cardiopulmonary symptomatology at this time her history of coronary artery disease.   Physical Exam: Awake and alert in no acute distress appears stated age. His abdominal exam is entirely benign without organomegaly, masses or tenderness. Bowel sounds are normal. Mental status is normal.    Assessment and Plan: Recurrent diverticulitis with associated segmental colitis and a 60 year old male otherwise healthy, a CT scan that showed evidence of coronary artery calcification. We will notify his primary care physician, and he may need cardiology referral. I have placed him on high fiber diet with daily Citrucel, liberal by mouth fluids, continued  Lialda 2.4 g a day with when necessary sublingual Levsin. He will see me as needed or yearly depending on his clinical course. Encounter Diagnosis  Name Primary?  . Colitis Yes

## 2010-08-08 ENCOUNTER — Telehealth: Payer: Self-pay | Admitting: Gastroenterology

## 2010-08-08 MED ORDER — CIPROFLOXACIN HCL 500 MG PO TABS: 500.0000 mg | ORAL_TABLET | Freq: Two times a day (BID) | ORAL | Status: AC

## 2010-08-08 NOTE — Telephone Encounter (Signed)
Pt with hx of LLQ pain, diverticulosis, last COLON showed segmental Colitis. Levsin and Lialda ordered. Pt reports an onset of lower abdominal pain- entire lower area- that started Monday. Pt is taking his meds- Lialda, Levsin and Tramadol. Pt denies fever, diarrhea or blood in his stool. He is afraid his s&s will worsen over the weekend and wonders if we can call in an antibiotic now? Please advise.

## 2010-08-08 NOTE — Telephone Encounter (Signed)
Notified pt Dr Sharlett Iles ordered Cipro x 7 days- pt requested I order drug at [facility].

## 2010-08-08 NOTE — Telephone Encounter (Signed)
CIPRO 500 MG BID FOR 7 DAYS.Marland KitchenMarland Kitchen

## 2010-12-22 ENCOUNTER — Other Ambulatory Visit: Payer: Self-pay | Admitting: Gastroenterology

## 2010-12-22 DIAGNOSIS — K529 Noninfective gastroenteritis and colitis, unspecified: Secondary | ICD-10-CM

## 2010-12-22 MED ORDER — MESALAMINE 1.2 G PO TBEC: DELAYED_RELEASE_TABLET | ORAL | Status: DC

## 2010-12-22 NOTE — Telephone Encounter (Signed)
[facility] [address]; notified pt.

## 2011-03-31 ENCOUNTER — Telehealth: Payer: Self-pay | Admitting: Gastroenterology

## 2011-03-31 DIAGNOSIS — K529 Noninfective gastroenteritis and colitis, unspecified: Secondary | ICD-10-CM

## 2011-03-31 MED ORDER — MESALAMINE 1.2 G PO TBEC: DELAYED_RELEASE_TABLET | ORAL | Status: DC

## 2011-03-31 NOTE — Telephone Encounter (Signed)
Pt called this am to report he now has a $2500 deductible for his meds/medical coverage. He wanted to know if we could order something cheaper than Lialda. Called his insurance company who had me call [facility]. [facility] has the plan, but [facility] is the mail order provider, BUT WE HAVE TO [place], ETC [place] is not preferred; Asacol, Apriso and Pentasa are preferred and would be cheaper. Informed pt and he will check on prices of the other drugs. Left him a few samples since he ran ou today; pt stated understanding.

## 2011-06-15 ENCOUNTER — Telehealth: Payer: Self-pay | Admitting: Gastroenterology

## 2011-06-15 DIAGNOSIS — K529 Noninfective gastroenteritis and colitis, unspecified: Secondary | ICD-10-CM

## 2011-06-15 NOTE — Telephone Encounter (Signed)
Pt states he has been on lialda for about a year. He has about another weeks worth and wants to know if Dr. Sharlett Iles wants him to continue taking the lialda. Pt knows Dr. Sharlett Iles is out of town. Will let him know on Thursday when Dr. Sharlett Iles returns. Dr. Sharlett Iles please advise.

## 2011-06-18 MED ORDER — MESALAMINE 1.2 G PO TBEC: DELAYED_RELEASE_TABLET | ORAL | Status: DC

## 2011-06-18 NOTE — Telephone Encounter (Signed)
Advised taking this medication regularly because of his recurrent diverticulitis. He should see me in 6 months

## 2011-06-18 NOTE — Telephone Encounter (Signed)
Informed pt of Dr Buel Ream suggestions; reordered the [place]. Reminder in to call pt for an appt in 6 months; pt stated understanding.

## 2011-11-18 ENCOUNTER — Telehealth: Payer: Self-pay | Admitting: *Deleted

## 2011-11-18 NOTE — Telephone Encounter (Signed)
lmom for pt to call back

## 2011-11-18 NOTE — Telephone Encounter (Signed)
Message copied by Lance Morin on Wed Nov 18, 2011  8:23 AM ------      Message from: Lance Morin      Created: Thu Jun 18, 2011  1:18 PM       Notify pt he needs an appt in October for f/u diverticulitis

## 2011-11-23 NOTE — Telephone Encounter (Signed)
Scheduled pt for a f/u on 12/22/11 with Dr Sharlett Iles.

## 2011-12-22 ENCOUNTER — Ambulatory Visit (INDEPENDENT_AMBULATORY_CARE_PROVIDER_SITE_OTHER): Payer: BC Managed Care – PPO | Admitting: Gastroenterology

## 2011-12-22 ENCOUNTER — Encounter: Payer: Self-pay | Admitting: Gastroenterology

## 2011-12-22 VITALS — BP 130/80 | HR 80 | Ht 67.5 in | Wt 178.4 lb

## 2011-12-22 DIAGNOSIS — G8929 Other chronic pain: Secondary | ICD-10-CM

## 2011-12-22 DIAGNOSIS — K5730 Diverticulosis of large intestine without perforation or abscess without bleeding: Secondary | ICD-10-CM

## 2011-12-22 DIAGNOSIS — K5289 Other specified noninfective gastroenteritis and colitis: Secondary | ICD-10-CM

## 2011-12-22 DIAGNOSIS — R1032 Left lower quadrant pain: Secondary | ICD-10-CM

## 2011-12-22 DIAGNOSIS — Z860100 Personal history of colon polyps, unspecified: Secondary | ICD-10-CM

## 2011-12-22 DIAGNOSIS — K529 Noninfective gastroenteritis and colitis, unspecified: Secondary | ICD-10-CM

## 2011-12-22 MED ORDER — MESALAMINE 1.2 G PO TBEC: DELAYED_RELEASE_TABLET | ORAL | Status: DC

## 2011-12-22 MED ORDER — HYOSCYAMINE SULFATE 0.125 MG SL SUBL: 0.1250 mg | SUBLINGUAL_TABLET | SUBLINGUAL | Status: DC | PRN

## 2011-12-22 NOTE — Progress Notes (Signed)
This is a 61 year old Caucasian male with chronic symptomatic diverticulosis. He is doing well at this time was current medications and denies complaints. His appetite is good and his weight is stable. He has a history of recurrent diverticulitis, and has refused surgical intervention in the past. Until recently, he was not taking amino salicylates as recommended for prophylaxis. He had colonoscopy 2 years ago with removal of some large adenomatous polyps, but denies rectal bleeding at this time.  Current Medications, Allergies, Past Medical History, Past Surgical History, Family History and Social History were reviewed in [employer] record.  Pertinent Review of Systems Negative   Physical Exam: Healthy patient in no acute distress. Vital signs normal. Abdominal exam shows no organomegaly, masses or significant tenderness.    Assessment and Plan: Symptomatic diverticulosis doing fairly well on Lialda 2.4 g a day to see if we can decrease the instance of his painful diverticulosis episodes. He is to continue with a high fiber diet, daily Metamucil, and liberal by mouth fluids with when necessary sublingual Levsin. I have re\re new his Lialda and we'll see him in 6 months time or when necessary as needed. Followup colonoscopy as per clinical protocol. I have again recommended surgical evaluation for possible sigmoid colectomy, but he is not interested in that consideration at this time. I have explained to him that if he has complicated diverticulitis, he would probably have to have emergent surgery with associated colostomy. He understands the risk of this decision, and we will continue as per above. Encounter Diagnosis  Name Primary?  . Colitis Yes

## 2011-12-22 NOTE — Patient Instructions (Addendum)
We have sent the following medications to your pharmacy for you to pick up at your convenience: Lialda Levsin SL Please continue metamucil.  Please increase fiber in your diet. Please follow up with Dr Sharlett Iles in 6 months.

## 2012-01-12 ENCOUNTER — Other Ambulatory Visit: Payer: Self-pay | Admitting: Gastroenterology

## 2012-01-12 DIAGNOSIS — K529 Noninfective gastroenteritis and colitis, unspecified: Secondary | ICD-10-CM

## 2012-01-12 MED ORDER — MESALAMINE 1.2 G PO TBEC: DELAYED_RELEASE_TABLET | ORAL | Status: DC

## 2012-01-12 NOTE — Telephone Encounter (Signed)
Medication sent.

## 2012-01-16 ENCOUNTER — Other Ambulatory Visit: Payer: Self-pay | Admitting: Gastroenterology

## 2012-01-25 ENCOUNTER — Ambulatory Visit (INDEPENDENT_AMBULATORY_CARE_PROVIDER_SITE_OTHER): Payer: BC Managed Care – PPO | Admitting: Internal Medicine

## 2012-01-25 ENCOUNTER — Encounter: Payer: Self-pay | Admitting: Internal Medicine

## 2012-01-25 VITALS — BP 116/78 | HR 85 | Temp 97.9°F | Ht 69.0 in | Wt 179.6 lb

## 2012-01-25 DIAGNOSIS — E785 Hyperlipidemia, unspecified: Secondary | ICD-10-CM

## 2012-01-25 DIAGNOSIS — Z0000 Encounter for general adult medical examination without abnormal findings: Secondary | ICD-10-CM

## 2012-01-25 NOTE — Progress Notes (Signed)
  Subjective:    Patient ID: Stephen Bryant, male    DOB: 10-30-1950, 61 y.o.   MRN: [id number]  HPI He is here for a wellness form completion. Past medical history was reviewed and updated.  The last lipid values on the chart where 1999. At that time his LDL was 196. His father and sister had heart attacks at 29.    Review of Systems He is on a heart healthy diet; he is not on a regular exercise program. He denies chest pain, palpitations, dyspnea, edema, or claudication.     Objective:   Physical Exam Gen.:  well-nourished in appearance. Alert, appropriate and cooperative throughout exam.  Eyes: No corneal or conjunctival inflammation noted.  Neck: No deformities, masses, or tenderness noted.  Thyroid normal. Lungs: Normal respiratory effort; chest expands symmetrically. Lungs are clear to auscultation without rales, wheezes, or increased work of breathing. Heart: Normal rate and rhythm. Normal S1 and S2. No gallop, click, or rub. S4 w/o murmur. Abdomen: Bowel sounds normal; abdomen soft and nontender. No masses, organomegaly or hernias noted.                                                                           Musculoskeletal/extremities: No clubbing, cyanosis, edema, or deformity noted. Range of motion  normal .Tone & strength  normal.Joints normal. Nail health  good. Vascular: Carotid, radial artery, dorsalis pedis and  posterior tibial pulses are full and equal. No bruits present. Neurologic: Alert and oriented x3. Deep tendon reflexes symmetrical and normal.          Skin: Intact without suspicious lesions or rashes. Lymph: No cervical, axillary lymphadenopathy present. Psych: Mood and affect are normal. Normally interactive                                                                                         Assessment & Plan:  #1 wellness evaluation #2 dyslipidemia, ? Risk #3 FH ischemic CAD/MI Plan: See orders and recommendations

## 2012-01-25 NOTE — Patient Instructions (Addendum)
Cardiovascular exercise, this can be as simple a program as walking, is recommended 30-45 minutes 3-4 times per week. If you're not exercising you should take 6-8 weeks to build up to this level.  Please  schedule fasting Labs : BMET,NMR Lipoprofile, hepatic panel, CBC & dif, TSH.PLEASE BRING THESE INSTRUCTIONS TO FOLLOW UP  LAB APPOINTMENT.This will guarantee correct labs are drawn, eliminating need for repeat blood sampling ( needle sticks ! ). Diagnoses /Codes: V70.0, V17.3, 272.4.  If you activate My Chart; the results can be released to you as soon as they populate from the lab. If you choose not to use this program; the labs have to be reviewed, copied & mailed  causing a delay in getting the results to you.

## 2012-01-26 ENCOUNTER — Other Ambulatory Visit (INDEPENDENT_AMBULATORY_CARE_PROVIDER_SITE_OTHER): Payer: BC Managed Care – PPO

## 2012-01-26 DIAGNOSIS — Z0000 Encounter for general adult medical examination without abnormal findings: Secondary | ICD-10-CM

## 2012-01-26 DIAGNOSIS — E785 Hyperlipidemia, unspecified: Secondary | ICD-10-CM

## 2012-01-26 DIAGNOSIS — Z8249 Family history of ischemic heart disease and other diseases of the circulatory system: Secondary | ICD-10-CM

## 2012-01-26 LAB — CBC WITH DIFFERENTIAL/PLATELET
Eosinophils Relative: 4.1 % (ref 0.0–5.0)
HCT: 44.5 % (ref 39.0–52.0)
Hemoglobin: 14.4 g/dL (ref 13.0–17.0)
Lymphs Abs: 2.4 10*3/uL (ref 0.7–4.0)
MCV: 91.7 fl (ref 78.0–100.0)
Monocytes Absolute: 0.7 10*3/uL (ref 0.1–1.0)
Monocytes Relative: 10.8 % (ref 3.0–12.0)
Neutro Abs: 3.4 10*3/uL (ref 1.4–7.7)
Platelets: 306 10*3/uL (ref 150.0–400.0)
RDW: 13.7 % (ref 11.5–14.6)
WBC: 6.9 10*3/uL (ref 4.5–10.5)

## 2012-01-26 LAB — BASIC METABOLIC PANEL
BUN: 15 mg/dL (ref 6–23)
CO2: 27 mEq/L (ref 19–32)
Calcium: 9.6 mg/dL (ref 8.4–10.5)
Creatinine, Ser: 0.9 mg/dL (ref 0.4–1.5)
Glucose, Bld: 120 mg/dL — ABNORMAL HIGH (ref 70–99)
Sodium: 137 mEq/L (ref 135–145)

## 2012-01-26 LAB — HEPATIC FUNCTION PANEL
ALT: 24 U/L (ref 0–53)
Bilirubin, Direct: 0.1 mg/dL (ref 0.0–0.3)

## 2012-01-26 LAB — TSH: TSH: 1.58 u[IU]/mL (ref 0.35–5.50)

## 2012-01-27 ENCOUNTER — Ambulatory Visit: Payer: BC Managed Care – PPO

## 2012-01-27 DIAGNOSIS — E785 Hyperlipidemia, unspecified: Secondary | ICD-10-CM

## 2012-01-27 LAB — LIPID PANEL
HDL: 66.7 mg/dL (ref >39.00)
Total CHOL/HDL Ratio: 4
Triglycerides: 91 mg/dL (ref 0.0–149.0)
VLDL: 18.2 mg/dL (ref 0.0–40.0)

## 2012-01-27 LAB — LDL CHOLESTEROL, DIRECT: Direct LDL: 201.5 mg/dL

## 2012-01-29 ENCOUNTER — Ambulatory Visit: Payer: BC Managed Care – PPO

## 2012-01-29 DIAGNOSIS — R7309 Other abnormal glucose: Secondary | ICD-10-CM

## 2012-01-29 LAB — HEMOGLOBIN A1C: Hgb A1c MFr Bld: 5.7 % (ref 4.6–6.5)

## 2012-02-07 ENCOUNTER — Encounter: Payer: Self-pay | Admitting: Internal Medicine

## 2012-03-07 ENCOUNTER — Telehealth: Payer: Self-pay | Admitting: Internal Medicine

## 2012-03-07 DIAGNOSIS — R079 Chest pain, unspecified: Secondary | ICD-10-CM

## 2012-03-07 NOTE — Telephone Encounter (Signed)
RN attempted to call patient, left a voice mail.

## 2012-03-08 NOTE — Telephone Encounter (Signed)
Spoke with patient, patient states he called yesterday for an appointment with Dr.Hopper , was sent to C-A-N (Triage Nurse), patient was asked a lot of questions and told he would get a return call. Patient states when someone finally called him back he was busy working under the house and missed the call.   Patient with dull constant chest pain x couple weeks. CP is in the center and is worse if patient takes deep breath. Patient denies SOB or left side pain. Per Dr.Hopper patient to have Chest Xray today and be seen today, otherwise go to the emergency room. Patient stated he is unable to get Chest Xray today, patient spoke directly with Dr.Hopper (Hopp was standing in front of me at the time of call) Dr.Hopper stressed the importance of being seen today for described symptoms, patient verbalized understanding of what Dr.Hopper was telling him but insisted on being seen tomorrow and having Chest Xray tomorrow.  Patient was offered to be seen at 1 pm or 4 pm tomorrow and patient preferred 4 pm. Patient indicated he will go to [facility] for Xray the earlier part of the day.

## 2012-03-09 ENCOUNTER — Ambulatory Visit (INDEPENDENT_AMBULATORY_CARE_PROVIDER_SITE_OTHER): Payer: BC Managed Care – PPO | Admitting: Internal Medicine

## 2012-03-09 ENCOUNTER — Encounter: Payer: Self-pay | Admitting: Internal Medicine

## 2012-03-09 ENCOUNTER — Ambulatory Visit (INDEPENDENT_AMBULATORY_CARE_PROVIDER_SITE_OTHER)
Admission: RE | Admit: 2012-03-09 | Discharge: 2012-03-09 | Disposition: A | Discharge status: Home / Self Care | Payer: BC Managed Care – PPO | Source: Ambulatory Visit | Attending: Internal Medicine | Admitting: Internal Medicine

## 2012-03-09 VITALS — BP 128/90 | HR 92 | Wt 180.6 lb

## 2012-03-09 DIAGNOSIS — R079 Chest pain, unspecified: Secondary | ICD-10-CM

## 2012-03-09 DIAGNOSIS — E785 Hyperlipidemia, unspecified: Secondary | ICD-10-CM

## 2012-03-09 LAB — CK TOTAL AND CKMB (NOT AT ARMC): Relative Index: 0.9 (ref 0.0–2.5)

## 2012-03-09 MED ORDER — SIMVASTATIN 20 MG PO TABS: 20.0000 mg | ORAL_TABLET | Freq: Every day | ORAL | Status: DC

## 2012-03-09 NOTE — Progress Notes (Signed)
  Subjective:    Patient ID: Stephen Bryant, male    DOB: [birth year], 62 y.o.   MRN: [id number]  HPI  He's had upper substernal chest burning which has been fairly consistent for 2.5 weeks. He did have a respiratory tract infection in late November of 2013. Although the pain seems to be persistent; it is less noticeable during the day when he is busy and distracted. There is no radiation of the pain to his jaw , arm, or back. It is nonexertional and not associated with diaphoresis or nausea.  The symptoms may be worse with deep breathing but there are no other exacerbating or relieving factors.  Advanced cholesterol panel performed 01/26/12 revealed an LDL of 211 with a total of 1977 total particles and 130 small dense particles. HDL is protective at 79. Triglycerides were excellent @ 76.Sister MI @ 43 & father MI @ 16  Chest x-ray reveals some hyperinflation but no active cardiopulmonary disease.  His brother had COPD.  He has smoked from age [age], up to one pack per day   Review of Systems   History of Trauma/lifting: no Shortness of breath: no Pleuritic: no Cough: dry   Edema: no  PND: no  Dizziness:no  Palpitations:no Syncope: no Indigestion:some; no dysphagia, melena, or rectal bleeding   Nasal congestion treated with Sudafed          Objective:   Physical Exam Gen.:  well-nourished in appearance. Alert, appropriate and cooperative throughout exam. Eyes: No corneal or conjunctival inflammation noted. No icterus Nose: External nasal exam reveals no deformity or inflammation. Nasal mucosa are pink and moist. No lesions or exudates noted.  Mouth: Oral mucosa and oropharynx reveal no lesions or exudates. Teeth in good repair. Neck: No deformities, masses, or tenderness noted.  Thyroid normal. Lungs: Normal respiratory effort; chest expands symmetrically. Lungs are clear to auscultation without rales, wheezes, or increased work of breathing.decreased BS Heart: Normal rate  and rhythm. Normal S1 and S2. No gallop, click, or rub. No murmur.Slightly distant heart attacks Abdomen: Bowel sounds normal; abdomen soft and nontender. No masses, organomegaly or hernias noted.  Musculoskeletal/extremities:  No clubbing, cyanosis, edema, or deformity noted.Tone & strength  normal.Joints normal. Nail health  Good. Homan's neg Vascular: Carotid, radial artery, dorsalis pedis and  posterior tibial pulses are full and equal. No bruits present. Neurologic: Alert and oriented x3.  Skin: Intact without suspicious lesions or rashes. Lymph: No cervical, axillary  lymphadenopathy present. Psych: Mood and affect are normal. Normally interactive                                                                                         Assessment & Plan:  #1 atypical chest pain in the context of dyslipidemia with the 20% long-term risk and some family history of coronary disease  Plan: Baby coated aspirin after breakfast daily; Prilosec OTC 30 minutes before breakfast. Cardiac enzymes and stress testing.

## 2012-03-09 NOTE — Patient Instructions (Addendum)
To prevent palpitations or premature beats, avoid stimulants such as decongestants, diet pills, nicotine, or caffeine (coffee, tea, cola, or chocolate) to excess. Risk of premature heart attack or stroke increases as LDL or BAD cholesterol rises, especially if there is family history of heart attack in males before 50 or women before 26. Based on your prior advanced testing, your LDL goal is < 140 , ideally < 105. Your present LDL increases long term heart attack or stroke risk 70 %. The best dietary  information on cholesterol is Dr Nunzio Cory book Eat, Drink & Be Healthy. Please  schedule fasting Labs after 10 weeks of statin : CK,Lipids, hepatic panel. PLEASE BRING THESE INSTRUCTIONS TO FOLLOW UP  LAB APPOINTMENT.This will guarantee correct labs are drawn, eliminating need for repeat blood sampling ( needle sticks ! ). Diagnoses /Codes: 272.4, 995.20. Please take enteric-coated aspirin 81 mg daily with breakfast. The triggers for dyspepsia or "heart burn"  include stress; the "aspirin family" ; alcohol; peppermint; and caffeine (coffee, tea, cola, and chocolate). The aspirin family would include aspirin and the nonsteroidal agents such as ibuprofen &  Naproxen. Tylenol would not cause reflux. If having dyspepsia ; food & drink should be avoided for @ least 2 hours before going to bed.  Prilosec OTC 30 min pre breakfast  If you activate My Chart; the results can be released to you as soon as they populate from the lab. If you choose not to use this program; the labs have to be reviewed, copied & mailed   causing a delay in getting the results to you.

## 2012-03-11 ENCOUNTER — Encounter: Payer: Self-pay | Admitting: Cardiology

## 2012-03-11 ENCOUNTER — Ambulatory Visit: Payer: BC Managed Care – PPO | Admitting: Internal Medicine

## 2012-03-11 ENCOUNTER — Ambulatory Visit (INDEPENDENT_AMBULATORY_CARE_PROVIDER_SITE_OTHER): Payer: BC Managed Care – PPO | Admitting: Cardiology

## 2012-03-11 VITALS — BP 122/70 | Ht 68.0 in | Wt 179.0 lb

## 2012-03-11 DIAGNOSIS — I208 Other forms of angina pectoris: Secondary | ICD-10-CM | POA: Insufficient documentation

## 2012-03-11 DIAGNOSIS — R079 Chest pain, unspecified: Secondary | ICD-10-CM

## 2012-03-11 DIAGNOSIS — E785 Hyperlipidemia, unspecified: Secondary | ICD-10-CM

## 2012-03-11 NOTE — Assessment & Plan Note (Signed)
Symptoms are atypical and not likely cardiac. However multiple risk factors including positive family history, hyperlipidemia and remote tobacco abuse. Plan exercise treadmill. If negative continue risk factor modification.

## 2012-03-11 NOTE — Patient Instructions (Addendum)
Your physician has requested that you have an exercise tolerance test. For further information please visit [email or web address]. Please also follow instruction sheet, as given.    Exercise Stress Electrocardiography An exercise stress test is a heart test (EKG) which is done while you are moving. You will walk on a treadmill. This test will tell your doctor how your heart does when it is forced to work harder and how much activity you can safely handle. BEFORE THE TEST  Wear shorts or athletic pants.  Wear comfortable tennis shoes.  Women need to wear a bra that allows patches to be put on under it. TEST  An EKG cable will be attached to your waist. This cable is hooked up to patches, which look like round stickers stuck to your chest.  You will be asked to walk on the treadmill.  You will walk until you are too tired or until you are told to stop.  Tell the doctor right away if you have:  Chest pain.  Leg cramps.  Shortness of breath.  Dizziness.  The test may last 30 minutes to 1 hour. The timing depends on your physical condition and the condition of your heart. AFTER THE TEST  You will rest for about 6 minutes. During this time, your heart rhythm and blood pressure will be checked.  The testing equipment will be removed from your body and you can get dressed.  You may go home or back to your hospital room. You may keep doing all your usual activities as told by your doctor. Finding out the results of your test Ask when your test results will be ready. Make sure you get your test results. Document Released: 08/05/2007 Document Revised: 05/11/2011 Document Reviewed: 08/05/2007 [facility] Patient Information 2013 [place].

## 2012-03-11 NOTE — Progress Notes (Signed)
HPI: 62 year old male with no prior cardiac history for evaluation of chest pain. There is note of an abdominal CT in April of 2012 that showed possible advanced coronary artery atherosclerosis on limited views.Seen recently by Dr. Linna Darner for chest pain felt to be atypical. Electrocardiogram showed no ST changes. Chest x-ray negative. Cardiac markers and d-dimer negative. Cardiology asked to evaluate. Patient had a URI in November. As it resolved he developed pain in his mid to upper substernal chest area. It is described as a mild burning pain. It has been continuous for one month. No radiation or associated symptoms. Some increased with cough. He otherwise denies dyspnea on exertion, orthopnea, PND, pedal edema, palpitations, syncope or exertional chest pain.  Current Outpatient Prescriptions  Medication Sig Dispense Refill  . hyoscyamine (LEVSIN SL) 0.125 MG SL tablet Place 1 tablet (0.125 mg total) under the tongue every 4 (four) hours as needed.  30 tablet  3  . LIALDA 1.2 G EC tablet TAKE TWO TABLETS BY MOUTH ONCE A DAY  60 tablet  6  . simvastatin (ZOCOR) 20 MG tablet Take 1 tablet (20 mg total) by mouth at bedtime.  30 tablet  2    No Known Allergies  Past Medical History  Diagnosis Date  . Hyperlipemia   . Esophageal reflux   . Diverticulosis of colon (without mention of hemorrhage)   . Personal history of colonic polyps 04/16/2010    tubulovillous adenomas  . Colitis     sigmoid colon     Past Surgical History  Procedure Date  . Colonoscopy  with polypectomy 2012    Dr Sharlett Iles  . Vasectomy     History   Social History  . Marital Status: Married    Spouse Name: N/A    Number of Children: 2  . Years of Education: N/A   Occupational History  . service tech    Social History Main Topics  . Smoking status: Former Smoker    Quit date: 03/03/2003  . Smokeless tobacco: Not on file     Comment: smoked age [age], up to 1 ppd  . Alcohol Use: 3.5 - 7.0 oz/week    7-14  drink(s) per week     Comment: 2 glasses of wine everyday or every other day  . Drug Use: No  . Sexually Active: Not on file   Other Topics Concern  . Not on file   Social History Narrative  . No narrative on file    Family History  Problem Relation Age of Onset  . Stomach cancer Mother   . COPD Brother   . Colon cancer Neg Hx   . Stroke Neg Hx   . Asthma Neg Hx   . Heart attack Father 65  . Diabetes Father   . Diabetes Maternal Grandmother   . Heart attack Sister 60    ROS: no fevers or chills, productive cough, hemoptysis, dysphasia, odynophagia, melena, hematochezia, dysuria, hematuria, rash, seizure activity, orthopnea, PND, pedal edema, claudication. Remaining systems are negative.  Physical Exam:   Blood pressure 122/70, height 5' 8"  (1.727 m), weight 179 lb (81.194 kg).  General:  Well developed/well nourished in NAD Skin warm/dry Patient not depressed No peripheral clubbing Back-normal HEENT-normal/normal eyelids Neck supple/normal carotid upstroke bilaterally; no bruits; no JVD; no thyromegaly chest - CTA/ normal expansion CV - RRR/normal S1 and S2; no murmurs, rubs or gallops;  PMI nondisplaced Abdomen -NT/ND, no HSM, no mass, + bowel sounds, no bruit 2+ femoral pulses, no bruits Ext-no  edema, chords, 2+ DP Neuro-grossly nonfocal  ECG 03/09/12-sinus rhythm with occasional PAC. No ST changes.

## 2012-03-11 NOTE — Assessment & Plan Note (Signed)
Recent LDL 200. Based on recent guidelines he should be placed on a statin and Zocor has been initiated. He will followup with Dr. Linna Darner for this issue.

## 2012-03-29 ENCOUNTER — Ambulatory Visit (INDEPENDENT_AMBULATORY_CARE_PROVIDER_SITE_OTHER): Payer: BC Managed Care – PPO | Admitting: Physician Assistant

## 2012-03-29 DIAGNOSIS — R079 Chest pain, unspecified: Secondary | ICD-10-CM

## 2012-03-29 NOTE — Progress Notes (Signed)
Exercise Treadmill Test  Pre-Exercise Testing Evaluation Rhythm: normal sinus  Rate: 73                 Test  Exercise Tolerance Test Ordering MD: Kirk Ruths, MD  Interpreting MD: Richardson Dopp, PA-C  Unique Test No: 1  Treadmill:  1  Indication for ETT: chest pain - rule out ischemia  Contraindication to ETT: No   Stress Modality: exercise - treadmill  Cardiac Imaging Performed: non   Protocol: standard Bruce - maximal  Max BP:  197/92  Max MPHR (bpm):  158 85% MPR (bpm):  134  MPHR obtained (bpm):  166 % MPHR obtained:  105%  Reached 85% MPHR (min:sec):  4:07 Total Exercise Time (min-sec):  7:01  Workload in METS:  8.5 Borg Scale: 20  Reason ETT Terminated:  desired heart rate attained    ST Segment Analysis At Rest: normal ST segments - no evidence of significant ST depression With Exercise: no evidence of significant ST depression  Other Information Arrhythmia:  No Angina during ETT:  absent (0) Quality of ETT:  diagnostic  ETT Interpretation:  normal - no evidence of ischemia by ST analysis  Comments: Fair exercise tolerance. No chest pain. Normal BP response to exercise. No ST-T changes to suggest ischemia.   Recommendations: Follow up with Dr. Kirk Ruths as directed. Danton Sewer, PA-C  9:28 AM 03/29/2012

## 2012-04-16 ENCOUNTER — Other Ambulatory Visit: Payer: Self-pay

## 2012-06-08 ENCOUNTER — Other Ambulatory Visit (INDEPENDENT_AMBULATORY_CARE_PROVIDER_SITE_OTHER): Payer: BC Managed Care – PPO

## 2012-06-08 DIAGNOSIS — E785 Hyperlipidemia, unspecified: Secondary | ICD-10-CM

## 2012-06-08 DIAGNOSIS — T887XXA Unspecified adverse effect of drug or medicament, initial encounter: Secondary | ICD-10-CM

## 2012-06-08 LAB — HEPATIC FUNCTION PANEL
Alkaline Phosphatase: 54 U/L (ref 39–117)
Bilirubin, Direct: 0.1 mg/dL (ref 0.0–0.3)
Total Bilirubin: 0.7 mg/dL (ref 0.3–1.2)
Total Protein: 7.1 g/dL (ref 6.0–8.3)

## 2012-06-08 LAB — LIPID PANEL
Cholesterol: 182 mg/dL (ref 0–200)
LDL Cholesterol: 105 mg/dL — ABNORMAL HIGH (ref 0–99)
Total CHOL/HDL Ratio: 3
VLDL: 10 mg/dL (ref 0.0–40.0)

## 2012-06-10 ENCOUNTER — Other Ambulatory Visit: Payer: Self-pay | Admitting: Internal Medicine

## 2012-06-13 ENCOUNTER — Encounter: Payer: Self-pay | Admitting: Internal Medicine

## 2013-01-02 ENCOUNTER — Telehealth: Payer: Self-pay | Admitting: Gastroenterology

## 2013-01-02 MED ORDER — MESALAMINE 1.2 G PO TBEC: 2.4000 g | DELAYED_RELEASE_TABLET | Freq: Every day | ORAL | Status: DC

## 2013-01-02 NOTE — Telephone Encounter (Signed)
Informed pt dr Sharlett Iles feels he should remain on Lialda as prophylaxis; pt stated understanding. Reordered his Lialda to his pharmacy.

## 2013-01-02 NOTE — Telephone Encounter (Signed)
I would continue as prophylaxis for recurrent problems

## 2013-01-02 NOTE — Telephone Encounter (Signed)
Ppt with hx of Colitis, Diverticulosis and Chronic LLQ pain on Lialda 2.4 G daily. Pt reports he hasn't had any problems in about 4 months and wonders if he can stop or cut back on Lialda; on 2.4 G now? Thanks.Marland Kitchen

## 2013-01-05 ENCOUNTER — Other Ambulatory Visit: Payer: Self-pay

## 2013-03-08 ENCOUNTER — Encounter: Payer: Self-pay | Admitting: Gastroenterology

## 2013-06-13 ENCOUNTER — Other Ambulatory Visit: Payer: Self-pay

## 2013-06-13 MED ORDER — SIMVASTATIN 20 MG PO TABS: ORAL_TABLET | ORAL | Status: DC

## 2013-07-13 ENCOUNTER — Other Ambulatory Visit: Payer: Self-pay

## 2013-07-20 ENCOUNTER — Encounter: Payer: Self-pay | Admitting: Internal Medicine

## 2013-07-20 ENCOUNTER — Other Ambulatory Visit (INDEPENDENT_AMBULATORY_CARE_PROVIDER_SITE_OTHER): Payer: BC Managed Care – PPO

## 2013-07-20 ENCOUNTER — Ambulatory Visit (INDEPENDENT_AMBULATORY_CARE_PROVIDER_SITE_OTHER): Payer: BC Managed Care – PPO | Admitting: Internal Medicine

## 2013-07-20 VITALS — BP 128/94 | HR 73 | Temp 98.4°F | Resp 13 | Wt 183.2 lb

## 2013-07-20 DIAGNOSIS — K5289 Other specified noninfective gastroenteritis and colitis: Secondary | ICD-10-CM

## 2013-07-20 DIAGNOSIS — E785 Hyperlipidemia, unspecified: Secondary | ICD-10-CM

## 2013-07-20 DIAGNOSIS — K529 Noninfective gastroenteritis and colitis, unspecified: Secondary | ICD-10-CM

## 2013-07-20 DIAGNOSIS — R030 Elevated blood-pressure reading, without diagnosis of hypertension: Secondary | ICD-10-CM

## 2013-07-20 DIAGNOSIS — R7309 Other abnormal glucose: Secondary | ICD-10-CM

## 2013-07-20 LAB — CBC WITH DIFFERENTIAL/PLATELET
Basophils Absolute: 0.1 10*3/uL (ref 0.0–0.1)
Basophils Relative: 1.2 % (ref 0.0–3.0)
EOS PCT: 5.4 % — AB (ref 0.0–5.0)
Eosinophils Absolute: 0.4 10*3/uL (ref 0.0–0.7)
HEMATOCRIT: 44 % (ref 39.0–52.0)
HEMOGLOBIN: 14.8 g/dL (ref 13.0–17.0)
LYMPHS ABS: 2.3 10*3/uL (ref 0.7–4.0)
Lymphocytes Relative: 33.4 % (ref 12.0–46.0)
MCHC: 33.7 g/dL (ref 30.0–36.0)
MCV: 89.3 fl (ref 78.0–100.0)
Monocytes Absolute: 0.8 10*3/uL (ref 0.1–1.0)
Monocytes Relative: 11.4 % (ref 3.0–12.0)
NEUTROS ABS: 3.4 10*3/uL (ref 1.4–7.7)
Neutrophils Relative %: 48.6 % (ref 43.0–77.0)
Platelets: 303 10*3/uL (ref 150.0–400.0)
RBC: 4.93 Mil/uL (ref 4.22–5.81)
RDW: 14.1 % (ref 11.5–15.5)
WBC: 7 10*3/uL (ref 4.0–10.5)

## 2013-07-20 LAB — LIPID PANEL
CHOL/HDL RATIO: 3
CHOLESTEROL: 189 mg/dL (ref 0–200)
HDL: 73.2 mg/dL (ref >39.00)
LDL CALC: 104 mg/dL — AB (ref 0–99)
Triglycerides: 59 mg/dL (ref 0.0–149.0)
VLDL: 11.8 mg/dL (ref 0.0–40.0)

## 2013-07-20 LAB — BASIC METABOLIC PANEL
BUN: 10 mg/dL (ref 6–23)
CHLORIDE: 106 meq/L (ref 96–112)
CO2: 30 mEq/L (ref 19–32)
Calcium: 9.1 mg/dL (ref 8.4–10.5)
Creatinine, Ser: 0.9 mg/dL (ref 0.4–1.5)
GFR: 91.47 mL/min (ref >60.00)
GLUCOSE: 103 mg/dL — AB (ref 70–99)
POTASSIUM: 5 meq/L (ref 3.5–5.1)
SODIUM: 141 meq/L (ref 135–145)

## 2013-07-20 LAB — HEPATIC FUNCTION PANEL
ALT: 27 U/L (ref 0–53)
AST: 31 U/L (ref 0–37)
Albumin: 4 g/dL (ref 3.5–5.2)
Alkaline Phosphatase: 54 U/L (ref 39–117)
BILIRUBIN TOTAL: 0.9 mg/dL (ref 0.2–1.2)
Bilirubin, Direct: 0.1 mg/dL (ref 0.0–0.3)
TOTAL PROTEIN: 7.2 g/dL (ref 6.0–8.3)

## 2013-07-20 LAB — TSH: TSH: 1.37 u[IU]/mL (ref 0.35–4.50)

## 2013-07-20 LAB — HEMOGLOBIN A1C: Hgb A1c MFr Bld: 5.8 % (ref 4.6–6.5)

## 2013-07-20 MED ORDER — SIMVASTATIN 20 MG PO TABS: ORAL_TABLET | ORAL | Status: DC

## 2013-07-20 NOTE — Progress Notes (Signed)
   Subjective:    Patient ID: Stephen Bryant, male    DOB: February 12, 1951, 63 y.o.   MRN: [id number]  HPI He is here to assess status of active health conditions:  Diet/ nutrition:"free for all" Exercise program: no   ELEVATED BP w/o DX of HYPERTENSION: Disease Monitoring: Blood pressure range/ average :no monitor Medication Compliance:none  FASTING HYPERGLYCEMIA  :  FBS range/average:no monitor Medication compliance:no Ophthamology care:not UTD   HYPERLIPIDEMIA: Disease Monitoring: Medication Compliance: yes     Review of Systems  Chest pain, no:       Dyspnea:no Edema:no Claudication: no Lightheadedness,Syncope:no Weight gain/loss: stable Polyuria/phagia/dipsia:no     Blurred vision /diplopia/lossof vision:no Limb numbness/tingling/burning:no Non healing skin lesions:no Abd pain, bowel changes: no. Note: GI F/U due  Myalgias:no Memory loss:no       Objective:   Physical Exam Appears healthy and well-nourished & in no acute distress  No carotid bruits are present.No neck pain distention present at 10 - 15 degrees. Thyroid normal to palpation  Heart rhythm and rate are normal with no gallop or murmur  Chest is clear with no increased work of breathing  There is no evidence of aortic aneurysm or renal artery bruits  Abdomen soft with no organomegaly or masses. No HJR  No clubbing, cyanosis or edema present.  Pedal pulses are intact   No ischemic skin changes are present . Fingernails healthy   Alert and oriented. Strength, tone, DTRs reflexes normal          Assessment & Plan:  See Current Assessment & Plan in Problem List under specific Diagnosis

## 2013-07-20 NOTE — Assessment & Plan Note (Signed)
Lipids, LFTs, TSH  

## 2013-07-20 NOTE — Assessment & Plan Note (Signed)
Blood pressure goals reviewed. BMET 

## 2013-07-20 NOTE — Progress Notes (Signed)
Pre visit review using our clinic review tool, if applicable. No additional management support is needed unless otherwise documented below in the visit note. 

## 2013-07-20 NOTE — Assessment & Plan Note (Addendum)
A1c

## 2013-07-20 NOTE — Assessment & Plan Note (Signed)
GI referral CBC & dif

## 2013-07-20 NOTE — Patient Instructions (Signed)
Your next office appointment will be determined based upon review of your pending labs . Those instructions will be transmitted to you through My Chart  OR  by mail;whichever process is your choice to receive results & recommendations . Cardiovascular exercise, this can be as simple a program as walking, is recommended 30-45 minutes 3-4 times per week. If you're not exercising you should take 6-8 weeks to build up to this level.  Minimal Blood Pressure Goal= AVERAGE < 140/90;  Ideal is an AVERAGE < 135/85. This AVERAGE should be calculated from @ least 5-7 BP readings taken @ different times of day on different days of week. You should not respond to isolated BP readings , but rather the AVERAGE for that week .Please bring your  blood pressure cuff to office visits to verify that it is reliable.It  can also be checked against the blood pressure device at the pharmacy. Finger or wrist cuffs are not dependable; an arm cuff is.

## 2013-07-25 ENCOUNTER — Encounter: Payer: Self-pay | Admitting: Internal Medicine

## 2013-08-05 ENCOUNTER — Other Ambulatory Visit: Payer: Self-pay | Admitting: *Deleted

## 2013-08-05 DIAGNOSIS — E785 Hyperlipidemia, unspecified: Secondary | ICD-10-CM

## 2013-08-05 MED ORDER — SIMVASTATIN 20 MG PO TABS: ORAL_TABLET | ORAL | Status: DC

## 2013-08-08 ENCOUNTER — Encounter: Payer: Self-pay | Admitting: Internal Medicine

## 2013-08-09 ENCOUNTER — Other Ambulatory Visit: Payer: Self-pay | Admitting: Internal Medicine

## 2013-08-09 DIAGNOSIS — E785 Hyperlipidemia, unspecified: Secondary | ICD-10-CM

## 2013-08-27 ENCOUNTER — Other Ambulatory Visit: Payer: Self-pay | Admitting: Gastroenterology

## 2013-08-28 NOTE — Telephone Encounter (Signed)
Yes, may refill x 2

## 2013-08-28 NOTE — Telephone Encounter (Signed)
Patient has a follow up visit with Dr. Fuller Plan for 09-11-2013. Patient is requesting refill on Lialda. Is it okay to refill?

## 2013-09-11 ENCOUNTER — Encounter: Payer: Self-pay | Admitting: Gastroenterology

## 2013-09-11 ENCOUNTER — Ambulatory Visit (INDEPENDENT_AMBULATORY_CARE_PROVIDER_SITE_OTHER): Payer: BC Managed Care – PPO | Admitting: Gastroenterology

## 2013-09-11 VITALS — BP 124/72 | HR 76 | Ht 67.5 in | Wt 180.0 lb

## 2013-09-11 DIAGNOSIS — K5289 Other specified noninfective gastroenteritis and colitis: Secondary | ICD-10-CM

## 2013-09-11 DIAGNOSIS — K5730 Diverticulosis of large intestine without perforation or abscess without bleeding: Secondary | ICD-10-CM

## 2013-09-11 DIAGNOSIS — Z86010 Personal history of colonic polyps: Secondary | ICD-10-CM

## 2013-09-11 MED ORDER — PEG-KCL-NACL-NASULF-NA ASC-C 100 G PO SOLR: 1.0000 | Freq: Once | ORAL | Status: DC

## 2013-09-11 MED ORDER — MESALAMINE 1.2 G PO TBEC: DELAYED_RELEASE_TABLET | ORAL | Status: DC

## 2013-09-11 NOTE — Patient Instructions (Signed)
We have sent the following medications to your pharmacy for you to pick up at your convenience: [place].  You have been scheduled for a colonoscopy. Please follow written instructions given to you at your visit today.  Please pick up your prep kit at the pharmacy within the next 1-3 days. If you use inhalers (even only as needed), please bring them with you on the day of your procedure. Your physician has requested that you go to www.startemmi.com and enter the access code given to you at your visit today. This web site gives a general overview about your procedure. However, you should still follow specific instructions given to you by our office regarding your preparation for the procedure.  Thank you for choosing me and [facility].  Pricilla Riffle. Dagoberto Ligas., MD., Marval Regal

## 2013-09-11 NOTE — Progress Notes (Signed)
    History of Present Illness: This is a 63 year old male with SCAD and a history of adenomatous colon polyps. He has occasional episodes of left lower quadrant abdominal pain that occur every few months and last about 12-24 hours. Advil seems to be most effective in controlling his symptoms although he usually takes hyoscyamine as well. He asked about possibly discontinuing Lialda.  Current Medications, Allergies, Past Medical History, Past Surgical History, Family History and Social History were reviewed in [employer] record.  Physical Exam: General: Well developed , well nourished, no acute distress Head: Normocephalic and atraumatic Eyes:  sclerae anicteric, EOMI Ears: Normal auditory acuity Mouth: No deformity or lesions Lungs: Clear throughout to auscultation Heart: Regular rate and rhythm; no murmurs, rubs or bruits Abdomen: Soft, non tender and non distended. No masses, hepatosplenomegaly or hernias noted. Normal Bowel sounds Rectal: Deferred to colonoscopy Musculoskeletal: Symmetrical with no gross deformities  Pulses:  Normal pulses noted Extremities: No clubbing, cyanosis, edema or deformities noted Neurological: Alert oriented x 4, grossly nonfocal Psychological:  Alert and cooperative. Normal mood and affect  Assessment and Recommendations:  1. SCAD with intermittent left lower quadrant pain. Left lower quadrant pain responds to Advil and ibuprofen. Unclear is Lialda is beneficial for him but he would like to remain on it and that is certainly reasonable.  2. Personal history of adenomatous colon polyps due for surveillance colonoscopy. The risks, benefits, and alternatives to colonoscopy with possible biopsy and possible polypectomy were discussed with the patient and they consent to proceed.

## 2013-09-15 ENCOUNTER — Encounter: Payer: Self-pay | Admitting: Gastroenterology

## 2013-11-13 ENCOUNTER — Telehealth: Payer: Self-pay | Admitting: Gastroenterology

## 2013-11-13 NOTE — Telephone Encounter (Signed)
Left message for patient to call back  

## 2013-11-13 NOTE — Telephone Encounter (Signed)
Patient with intermittent dysphagia for the last few weeks.  He would like to add an EGD to colon on Wed.  I have explained that would need to reschedule for another day if Dr. Fuller Plan wanted to proceed with EGD.  He would like to leave the colonoscopy as scheduled.  He has already to have the day off of work and a ride lined up.  He will discuss with Dr. Fuller Plan at the time of the procedure.

## 2013-11-15 ENCOUNTER — Encounter: Payer: BC Managed Care – PPO | Admitting: Gastroenterology

## 2013-11-15 ENCOUNTER — Encounter: Payer: Self-pay | Admitting: Gastroenterology

## 2013-11-15 ENCOUNTER — Ambulatory Visit (AMBULATORY_SURGERY_CENTER): Payer: BC Managed Care – PPO | Admitting: Gastroenterology

## 2013-11-15 VITALS — BP 142/93 | HR 68 | Temp 97.3°F | Resp 15 | Ht 67.5 in | Wt 180.0 lb

## 2013-11-15 DIAGNOSIS — Z86010 Personal history of colonic polyps: Secondary | ICD-10-CM

## 2013-11-15 DIAGNOSIS — D124 Benign neoplasm of descending colon: Secondary | ICD-10-CM

## 2013-11-15 DIAGNOSIS — D126 Benign neoplasm of colon, unspecified: Secondary | ICD-10-CM

## 2013-11-15 MED ORDER — SODIUM CHLORIDE 0.9 % IV SOLN: 500.0000 mL | INTRAVENOUS | Status: DC

## 2013-11-15 NOTE — Progress Notes (Signed)
Called to room to assist during endoscopic procedure.  Patient ID and intended procedure confirmed with present staff. Received instructions for my participation in the procedure from the performing physician.  

## 2013-11-15 NOTE — Patient Instructions (Signed)
YOU HAD AN ENDOSCOPIC PROCEDURE TODAY AT [facility]: Refer to the procedure report that was given to you for any specific questions about what was found during the examination.  If the procedure report does not answer your questions, please call your gastroenterologist to clarify.  If you requested that your care partner not be given the details of your procedure findings, then the procedure report has been included in a sealed envelope for you to review at your convenience later.  YOU SHOULD EXPECT: Some feelings of bloating in the abdomen. Passage of more gas than usual.  Walking can help get rid of the air that was put into your GI tract during the procedure and reduce the bloating. If you had a lower endoscopy (such as a colonoscopy or flexible sigmoidoscopy) you may notice spotting of blood in your stool or on the toilet paper. If you underwent a bowel prep for your procedure, then you may not have a normal bowel movement for a few days.  DIET: Your first meal following the procedure should be a light meal and then it is ok to progress to your normal diet.  A half-sandwich or bowl of soup is an example of a good first meal.  Heavy or fried foods are harder to digest and may make you feel nauseous or bloated.  Likewise meals heavy in dairy and vegetables can cause extra gas to form and this can also increase the bloating.  Drink plenty of fluids but you should avoid alcoholic beverages for 24 hours.  ACTIVITY: Your care partner should take you home directly after the procedure.  You should plan to take it easy, moving slowly for the rest of the day.  You can resume normal activity the day after the procedure however you should NOT DRIVE or use heavy machinery for 24 hours (because of the sedation medicines used during the test).    SYMPTOMS TO REPORT IMMEDIATELY: A gastroenterologist can be reached at any hour.  During normal business hours, 8:30 AM to 5:00 PM Monday through Friday,  call [phone].  After hours and on weekends, please call the GI answering service at [phone] who will take a message and have the physician on call contact you.   Following lower endoscopy (colonoscopy or flexible sigmoidoscopy):  Excessive amounts of blood in the stool  Significant tenderness or worsening of abdominal pains  Swelling of the abdomen that is new, acute  Fever of 100F or higher   FOLLOW UP: If any biopsies were taken you will be contacted by phone or by letter within the next 1-3 weeks.  Call your gastroenterologist if you have not heard about the biopsies in 3 weeks.  Our staff will call the home number listed on your records the next business day following your procedure to check on you and address any questions or concerns that you may have at that time regarding the information given to you following your procedure. This is a courtesy call and so if there is no answer at the home number and we have not heard from you through the emergency physician on call, we will assume that you have returned to your regular daily activities without incident.  SIGNATURES/CONFIDENTIALITY: You and/or your care partner have signed paperwork which will be entered into your electronic medical record.  These signatures attest to the fact that that the information above on your After Visit Summary has been reviewed and is understood.  Full responsibility of the confidentiality of  this discharge information lies with you and/or your care-partner.    Resume medications. Information given on polyps, diverticulosis,hemorrhoids and high fiber diet with discharge instructions.

## 2013-11-15 NOTE — Progress Notes (Signed)
Pt states he drank "1/2 cup of water" at 645.  Milus Mallick RN made Starleen Arms CRNA aware of this and ok to proceed per CRNA

## 2013-11-15 NOTE — Progress Notes (Signed)
Patient awakening,vss,report to rn 

## 2013-11-15 NOTE — Op Note (Signed)
[place]  [address]. [place], [address]   COLONOSCOPY PROCEDURE REPORT  PATIENT: Stephen, Bryant.  MR#: [id number] BIRTHDATE: 02-Oct-1950 , 63  yrs. old GENDER: Male ENDOSCOPIST: Ladene Artist, MD, [facility] PROCEDURE DATE:  11/15/2013 PROCEDURE:   Colonoscopy with snare polypectomy First Screening Colonoscopy - Avg.  risk and is 50 yrs.  old or older - No.  Prior Negative Screening - Now for repeat screening. N/A  History of Adenoma - Now for follow-up colonoscopy & has been > or = to 3 yrs.  Yes hx of adenoma.  Has been 3 or more years since last colonoscopy.  Polyps Removed Today? Yes. ASA CLASS:   Class II INDICATIONS:Patient's personal history of adenomatous colon polyps.  MEDICATIONS: MAC sedation, administered by CRNA and propofol (Diprivan) 247m IV DESCRIPTION OF PROCEDURE:   After the risks benefits and alternatives of the procedure were thoroughly explained, informed consent was obtained.  A digital rectal exam revealed no abnormalities of the rectum.   The LB [id number] endoscope was introduced through the anus and advanced to the cecum, which was identified by both the appendix and ileocecal valve. No adverse events experienced.   The quality of the prep was excellent, using MoviPrep  The instrument was then slowly withdrawn as the colon was fully examined.  COLON FINDINGS: Two AVMs at the cecum and in the ascending colon. No bleeding was noted.   A sessile polyp measuring 6 mm in size was found in the descending colon.  A polypectomy was performed with a cold snare.  The resection was complete and the polyp tissue was completely retrieved.   There was moderate diverticulosis noted in the descending colon and sigmoid colon with associated muscular hypertrophy and mild segmetal colitis.   The colon was otherwise normal.  There was no diverticulosis, inflammation, polyps or cancers unless previously stated.  Retroflexed views revealed  small internal hemorrhoids. The time to cecum=2 minutes 28 seconds. Withdrawal time=9 minutes 44 seconds.  The scope was withdrawn and the procedure completed. COMPLICATIONS: There were no complications.  ENDOSCOPIC IMPRESSION: 1.   2 AVMs at the cecum and in the ascending colon 2.   Sessile polyp measuring 6 mm in the descending colon; polypectomy performed with a cold snare 3.   Moderate diverticulosis with mild segment colitis in descending colon and sigmoid colon 4.   Small internal hemorrhoids  RECOMMENDATIONS: 1.  Await pathology results 2.  Repeat Colonoscopy in 5 years.  eSigned:  MLadene Artist MD, FWellstar Windy Hill Hospital09/16/2015 8:28 AM

## 2013-11-16 ENCOUNTER — Telehealth: Payer: Self-pay | Admitting: *Deleted

## 2013-11-16 NOTE — Telephone Encounter (Signed)
Message left

## 2013-11-20 ENCOUNTER — Encounter: Payer: Self-pay | Admitting: Gastroenterology

## 2013-12-14 ENCOUNTER — Encounter: Payer: Self-pay | Admitting: Internal Medicine

## 2013-12-14 ENCOUNTER — Ambulatory Visit (INDEPENDENT_AMBULATORY_CARE_PROVIDER_SITE_OTHER)
Admission: RE | Admit: 2013-12-14 | Discharge: 2013-12-14 | Disposition: A | Discharge status: Home / Self Care | Payer: BC Managed Care – PPO | Source: Ambulatory Visit | Attending: Internal Medicine | Admitting: Internal Medicine

## 2013-12-14 ENCOUNTER — Ambulatory Visit (INDEPENDENT_AMBULATORY_CARE_PROVIDER_SITE_OTHER): Payer: BC Managed Care – PPO | Admitting: Internal Medicine

## 2013-12-14 VITALS — BP 132/92 | HR 66 | Temp 98.3°F | Resp 12 | Wt 181.5 lb

## 2013-12-14 DIAGNOSIS — M5010 Cervical disc disorder with radiculopathy, unspecified cervical region: Secondary | ICD-10-CM

## 2013-12-14 MED ORDER — MELOXICAM 7.5 MG PO TABS: 7.5000 mg | ORAL_TABLET | Freq: Two times a day (BID) | ORAL | Status: DC | PRN

## 2013-12-14 NOTE — Progress Notes (Signed)
   Subjective:    Patient ID: Stephen Bryant, male    DOB: [birth year], 63 y.o.   MRN: [id number]  HPI   He has had left shoulder area pain daily for the last 3 weeks. It does radiate to the neck and intermittently down the arm. He's had some associated intermittent hand weakness without numbness or tingling.  It has improved in the morning and worse by end of day.  There was no specific injury or trigger prior to onset of symptoms  It is described as dull up to level IV and can last hours.   Review of Systems  He has no associated change in color or temperature of skin. He's had no associated rash  He has no constitutional symptoms of fever, chills, or weight loss.  There is no incontinence of urine or stool.      Objective:   Physical Exam   Pertinent positive findings include: He has some discomfort with left lateral neck rotation. Cranial nerve exam, deep tendon reflexes, strength, and tone are normal in upper extremities.  General appearance :adequately nourished; in no distress. Eyes: No conjunctival inflammation or scleral icterus is present. Heart:  Normal rate and regular rhythm. S1 and S2 normal without gallop, murmur, click, rub or other extra sounds   Lungs:Chest clear to auscultation; no wheezes, rhonchi,rales ,or rubs present.No increased work of breathing.  Vascular : all pulses equal ; no bruits present. Skin:Warm & dry.  Intact without suspicious lesions or rashes ; no jaundice or tenting Lymphatic: No lymphadenopathy is noted about the head or neck.            Assessment & Plan:  #1 cervical radiculopathy See Orders & AVS

## 2013-12-14 NOTE — Progress Notes (Signed)
Pre visit review using our clinic review tool, if applicable. No additional management support is needed unless otherwise documented below in the visit note. 

## 2013-12-14 NOTE — Patient Instructions (Signed)
Your next office appointment will be determined based upon review of your pending x-rays. Those instructions will be transmitted to you through My Chart    Followup as needed for your acute issue. Please report any significant change in your symptoms. Use a cervical memory foam pillow to prevent hyperextension or hyperflexion of the cervical spine.

## 2014-01-22 ENCOUNTER — Telehealth: Payer: Self-pay | Admitting: Internal Medicine

## 2014-01-22 NOTE — Telephone Encounter (Signed)
I recommend he see Dr Charlann Boxer, Sports Medicine specialist., phone # [phone].Ultrasound may be recommended in lieu of MRI

## 2014-01-22 NOTE — Telephone Encounter (Signed)
Pt called in and said that he is still having issues with shoulder and would like a referral for a MRI.  He said that Dr hopper suggested it if it wasn't feeling better.

## 2014-01-22 NOTE — Telephone Encounter (Signed)
Patient has been advised and transferred to scheduling for an appointment.

## 2014-02-05 ENCOUNTER — Ambulatory Visit (INDEPENDENT_AMBULATORY_CARE_PROVIDER_SITE_OTHER): Payer: BC Managed Care – PPO | Admitting: Family Medicine

## 2014-02-05 ENCOUNTER — Other Ambulatory Visit (INDEPENDENT_AMBULATORY_CARE_PROVIDER_SITE_OTHER): Payer: BC Managed Care – PPO

## 2014-02-05 ENCOUNTER — Encounter: Payer: Self-pay | Admitting: Family Medicine

## 2014-02-05 VITALS — BP 126/74 | HR 70 | Ht 68.0 in | Wt 182.0 lb

## 2014-02-05 DIAGNOSIS — S4990XA Unspecified injury of shoulder and upper arm, unspecified arm, initial encounter: Secondary | ICD-10-CM | POA: Insufficient documentation

## 2014-02-05 DIAGNOSIS — M25512 Pain in left shoulder: Secondary | ICD-10-CM

## 2014-02-05 DIAGNOSIS — M7550 Bursitis of unspecified shoulder: Secondary | ICD-10-CM | POA: Insufficient documentation

## 2014-02-05 DIAGNOSIS — M7552 Bursitis of left shoulder: Secondary | ICD-10-CM

## 2014-02-05 DIAGNOSIS — S4992XA Unspecified injury of left shoulder and upper arm, initial encounter: Secondary | ICD-10-CM

## 2014-02-05 NOTE — Progress Notes (Signed)
[name] [address] [place], [place] [address] Phone: [phone] Subjective:    I'm seeing this patient by the request  of:  Unice Cobble, MD   CC: Left shoulder pain  GEX:[id number] Stephen Bryant is a 63 y.o. male coming in with complaint of left shoulder pain. Patient has been having this pain for approximately 2 months now. Patient did see primary care provider and was concern for more of a cervical radiculopathy. Patient states since then it seems to be more localized to the shoulder. Patient states that there is an association of intermittent hand weakness as well as numbness and tingling. Denies any constant symptoms. Patient does not remember any true injury and does not know which exacerbating factors occur. Patient was the severity of the problem as 7 out of 10. Patient did have x-rays at primary care's office. X-rays show normal alignment with mild degenerative disc disease at C6-C7.     Past medical history, social, surgical and family history all reviewed in electronic medical record.   Review of Systems: No headache, visual changes, nausea, vomiting, diarrhea, constipation, dizziness, abdominal pain, skin rash, fevers, chills, night sweats, weight loss, swollen lymph nodes, body aches, joint swelling, muscle aches, chest pain, shortness of breath, mood changes.   Objective Blood pressure 126/74, pulse 70, height 5' 8"  (1.727 m), weight 182 lb (82.555 kg), SpO2 98 %.  General: No apparent distress alert and oriented x3 mood and affect normal, dressed appropriately.  HEENT: Pupils equal, extraocular movements intact  Respiratory: Patient's speak in full sentences and does not appear short of breath  Cardiovascular: No lower extremity edema, non tender, no erythema  Skin: Warm dry intact with no signs of infection or rash on extremities or on axial skeleton.  Abdomen: Soft nontender  Neuro: Cranial nerves II through XII are intact,  neurovascularly intact in all extremities with 2+ DTRs and 2+ pulses.  Lymph: No lymphadenopathy of posterior or anterior cervical chain or axillae bilaterally.  Gait normal with good balance and coordination.  MSK:  Non tender with full range of motion and good stability and symmetric strength and tone of elbows, wrist, hip, knee and ankles bilaterally.  Neck: Inspection unremarkable. No palpable stepoffs. Negative Spurling's maneuver. Mild decrease in range of motion with side bending as well as rotation bilaterally but no significant crepitus. Grip strength and sensation normal in bilateral hands Strength good C4 to T1 distribution No sensory change to C4 to T1 Negative Hoffman sign bilaterally Reflexes normal Shoulder: left Inspection reveals no abnormalities, atrophy or asymmetry. Tender over the acromion clavicular joint ROM is full in all planes passively. Rotator cuff strength normal throughout. signs of impingement with positive Neer and Hawkin's tests, but negative empty can sign. Speeds and Yergason's tests normal. Positive crossover sign Normal scapular function observed. No painful arc and no drop arm sign. No apprehension sign  MSK US performed of: left This study was ordered, performed, and interpreted by Charlann Boxer D.O.  Shoulder:   Supraspinatus:  Appears normal on long and transverse views, Bursal bulge seen with shoulder abduction on impingement view. Infraspinatus:  Appears normal on long and transverse views. Significant increase in Doppler flow Subscapularis:  Appears normal on long and transverse views. Positive bursa Teres Minor:  Appears normal on long and transverse views. AC joint:  Significant capsule distention with mild osteophytic changes Glenohumeral Joint:  Appears normal without effusion. Glenoid Labrum:  Intact without visualized tears. Biceps Tendon:  Appears normal  on long and transverse views, no fraying of tendon, tendon located in  intertubercular groove, no subluxation with shoulder internal or external rotation.  Impression: Subacromial bursitis with before meals joint mild arthritis but significant inflammation.  Procedure: Real-time Ultrasound Guided Injection of left acromioclavicular joint Device: GE Logiq E  Ultrasound guided injection is preferred based studies that show increased duration, increased effect, greater accuracy, decreased procedural pain, increased response rate with ultrasound guided versus blind injection.  Verbal informed consent obtained.  Time-out conducted.  Noted no overlying erythema, induration, or other signs of local infection.  Skin prepped in a sterile fashion.  Local anesthesia: Topical Ethyl chloride.  With sterile technique and under real time ultrasound guidance:  Joint visualized.  23g 1  inch needle inserted posterior approach. Pictures taken for needle placement. Patient did have injection of 2 cc 0.5% Marcaine, and 1.0 cc of Kenalog 40 mg/dL. Completed without difficulty  Pain immediately resolved suggesting accurate placement of the medication.  Advised to call if fevers/chills, erythema, induration, drainage, or persistent bleeding.  Images permanently stored and available for review in the ultrasound unit.  Impression: Technically successful ultrasound guided injection.     Impression and Recommendations:     This case required medical decision making of moderate complexity.

## 2014-02-05 NOTE — Assessment & Plan Note (Signed)
Patient given an injection today with fairly good resolution of pain. I do think there is some underlying subacromial bursitis and still the differential includes cervical radiculopathy. Because though patient x-rays do not have any significant findings I would continue to monitor. Patient is not having as much of the radicular symptoms as he was having previously. Patient given home exercises, topical anti-inflammatories, icing protocol. Patient will try these different changes and come back in 3 weeks. If continuing to have difficulty I would continue conservative therapy but likely injection into the shoulder.

## 2014-02-05 NOTE — Patient Instructions (Addendum)
Good to see you.  Ice 20 minutes 2 times daily. Usually after activity and before bed. Exercises 3 times a week. Alternating the neck and the shoulder exercises.  We tried injection in [facility] joint and if not much better can try one in your shoulder.  Try pennsaid twice daily See me again in 3-4 weeks.

## 2014-03-06 ENCOUNTER — Ambulatory Visit (INDEPENDENT_AMBULATORY_CARE_PROVIDER_SITE_OTHER): Payer: BLUE CROSS/BLUE SHIELD | Admitting: Family Medicine

## 2014-03-06 ENCOUNTER — Encounter: Payer: Self-pay | Admitting: Family Medicine

## 2014-03-06 VITALS — BP 124/84 | HR 72 | Ht 68.0 in | Wt 181.0 lb

## 2014-03-06 DIAGNOSIS — S4992XD Unspecified injury of left shoulder and upper arm, subsequent encounter: Secondary | ICD-10-CM

## 2014-03-06 NOTE — Progress Notes (Signed)
  [name] [place] [address], [place] [address] Phone: [phone] Subjective:    I'm seeing this patient by the request  of:  Unice Cobble, MD   CC: Left shoulder pain follow up  QMG:[id number] Stephen Bryant is a 64 y.o. male coming in with complaint of left shoulder pain. Patient was seen previously and had moderate osteophytic changes of the acromial clavicular joint. Patient did have an injection, home exercises were given, icing protocol. Patient states that he is 95% better. States that he is feeling significantly better overall and this is not stopping him from any activities and is resting comfortably. Patient is not taking the anti-inflammatory anymore. Patient is very happy with the results..     Past medical history, social, surgical and family history all reviewed in electronic medical record.   Review of Systems: No headache, visual changes, nausea, vomiting, diarrhea, constipation, dizziness, abdominal pain, skin rash, fevers, chills, night sweats, weight loss, swollen lymph nodes, body aches, joint swelling, muscle aches, chest pain, shortness of breath, mood changes.   Objective Blood pressure 124/84, pulse 72, height 5' 8"  (1.727 m), weight 181 lb (82.101 kg), SpO2 98 %.  General: No apparent distress alert and oriented x3 mood and affect normal, dressed appropriately.  HEENT: Pupils equal, extraocular movements intact  Respiratory: Patient's speak in full sentences and does not appear short of breath  Cardiovascular: No lower extremity edema, non tender, no erythema  Skin: Warm dry intact with no signs of infection or rash on extremities or on axial skeleton.  Abdomen: Soft nontender  Neuro: Cranial nerves II through XII are intact, neurovascularly intact in all extremities with 2+ DTRs and 2+ pulses.  Lymph: No lymphadenopathy of posterior or anterior cervical chain or axillae bilaterally.  Gait normal with good balance and  coordination.  MSK:  Non tender with full range of motion and good stability and symmetric strength and tone of elbows, wrist, hip, knee and ankles bilaterally.  Neck: Inspection unremarkable. No palpable stepoffs. Negative Spurling's maneuver. Mild decrease in range of motion with side bending as well as rotation bilaterally but no significant crepitus. Grip strength and sensation normal in bilateral hands Strength good C4 to T1 distribution No sensory change to C4 to T1 Negative Hoffman sign bilaterally Reflexes normal Shoulder: left Inspection reveals no abnormalities, atrophy or asymmetry. Tender over the acromion clavicular jointbut minimal compared to previous exam ROM is full in all planes passively. Rotator cuff strength normal throughout. signs of impingement with positive Neer and Hawkin's tests, but negative empty can sign still significantly better than previous exam Speeds and Yergason's tests normal. Negative crossover sign Normal scapular function observed. No painful arc and no drop arm sign. No apprehension sign Contralateral shoulder unremarkable      Impression and Recommendations:     This case required medical decision making of moderate complexity.

## 2014-03-06 NOTE — Patient Instructions (Signed)
Good to see you Ice is your friend Continue the exercises 2-3 times a week for another 6 weeks See me when you need me.

## 2014-03-06 NOTE — Assessment & Plan Note (Signed)
Patient is doing much better at this time. Discussed that we can repeat injection every 3-4 months if necessary. Encourage him to continue to do the home exercises on a regular basis. Patient will come back and see me again on an as needed

## 2014-05-01 HISTORY — PX: ESOPHAGOGASTRODUODENOSCOPY (EGD) WITH ESOPHAGEAL DILATION: SHX5812

## 2014-05-15 ENCOUNTER — Encounter: Payer: Self-pay | Admitting: Gastroenterology

## 2014-05-15 ENCOUNTER — Ambulatory Visit (INDEPENDENT_AMBULATORY_CARE_PROVIDER_SITE_OTHER): Payer: BLUE CROSS/BLUE SHIELD | Admitting: Gastroenterology

## 2014-05-15 VITALS — BP 124/86 | HR 80 | Ht 67.5 in | Wt 181.2 lb

## 2014-05-15 DIAGNOSIS — K219 Gastro-esophageal reflux disease without esophagitis: Secondary | ICD-10-CM

## 2014-05-15 DIAGNOSIS — Z86010 Personal history of colonic polyps: Secondary | ICD-10-CM

## 2014-05-15 DIAGNOSIS — R1314 Dysphagia, pharyngoesophageal phase: Secondary | ICD-10-CM

## 2014-05-15 MED ORDER — OMEPRAZOLE 20 MG PO CPDR: 20.0000 mg | DELAYED_RELEASE_CAPSULE | Freq: Every day | ORAL | Status: DC

## 2014-05-15 NOTE — Progress Notes (Signed)
History of Present Illness: This is a 64 year old male who relates the recent onset of solid food dysphagia. For about the past 2-3 months he has noted difficulty swallowing solids approximately once per week. He has had intermittent mild heartburn symptoms over the years which have increased in frequency in the past 2-3 months. Denies weight loss, abdominal pain, constipation, diarrhea, change in stool caliber, melena, hematochezia, nausea, vomiting, chest pain.   No Known Allergies Outpatient Prescriptions Prior to Visit  Medication Sig Dispense Refill  . mesalamine (LIALDA) 1.2 G EC tablet TAKE 2 TABLETS (2.4 G TOTAL) BY MOUTH DAILY. (Patient taking differently: TAKE 1 TABLETS (2.4 G TOTAL) BY MOUTH DAILY.) 60 tablet 11  . hyoscyamine (LEVSIN SL) 0.125 MG SL tablet Place 1 tablet (0.125 mg total) under the tongue every 4 (four) hours as needed. 30 tablet 3  . meloxicam (MOBIC) 7.5 MG tablet Take 1 tablet (7.5 mg total) by mouth 2 (two) times daily as needed for pain. 30 tablet 0   No facility-administered medications prior to visit.   Past Medical History  Diagnosis Date  . Hyperlipemia   . Esophageal reflux   . Diverticulosis of colon (without mention of hemorrhage)   . Personal history of colonic polyps 04/16/2010    tubulovillous adenomas  . Colitis     sigmoid colon   . AVM (arteriovenous malformation) of colon    Past Surgical History  Procedure Laterality Date  . Colonoscopy  with polypectomy  2012    Dr Sharlett Iles  . Vasectomy     History   Social History  . Marital Status: Married    Spouse Name: N/A  . Number of Children: 2  . Years of Education: N/A   Occupational History  . service tech    Social History Main Topics  . Smoking status: Former Smoker    Quit date: 03/03/2003  . Smokeless tobacco: Never Used     Comment: smoked age [age], up to 1 ppd  . Alcohol Use: 3.5 - 7.0 oz/week    7-14 drink(s) per week     Comment: 2 glasses of wine everyday or  every other day  . Drug Use: No  . Sexual Activity: Not on file   Other Topics Concern  . None   Social History Narrative   Family History  Problem Relation Age of Onset  . Stomach cancer Mother   . COPD Brother   . Colon cancer Neg Hx   . Stroke Neg Hx   . Asthma Neg Hx   . Esophageal cancer Neg Hx   . Rectal cancer Neg Hx   . Heart attack Father 4  . Diabetes Father   . Diabetes Maternal Grandmother   . Heart attack Sister 12      Physical Exam: General: Well developed , well nourished, no acute distress Head: Normocephalic and atraumatic Eyes:  sclerae anicteric, EOMI Ears: Normal auditory acuity Mouth: No deformity or lesions Lungs: Clear throughout to auscultation Heart: Regular rate and rhythm; no murmurs, rubs or bruits Abdomen: Soft, non tender and non distended. No masses, hepatosplenomegaly or hernias noted. Normal Bowel sounds Musculoskeletal: Symmetrical with no gross deformities  Pulses:  Normal pulses noted Extremities: No clubbing, cyanosis, edema or deformities noted Neurological: Alert oriented x 4, grossly nonfocal Psychological:  Alert and cooperative. Normal mood and affect  Assessment and Recommendations:  1. Dysphagia, new-onset, solid food. GERD. Rule out stricture or neoplasm. Begin omeprazole 20 mg daily and standard antireflux measures. Schedule  EGD with possible dilation. The risks (including bleeding, perforation, infection, missed lesions, medication reactions and possible hospitalization or surgery if complications occur), benefits, and alternatives to endoscopy with possible biopsy and possible dilation were discussed with the patient and they consent to proceed.   2. SCAD with intermittent left lower quadrant pain. Left lower quadrant pain responds to Advil and ibuprofen. Unclear is Lialda is beneficial but he would like to remain on it and that is certainly reasonable.  3. Personal history of adenomatous colon polyps. Surveillance  colonoscopy is due in 11/2018.

## 2014-05-15 NOTE — Patient Instructions (Addendum)
We have sent the following medications to your pharmacy for you to pick up at your convenience:omeprazole.  Patient advised to avoid spicy, acidic, citrus, chocolate, mints, fruit and fruit juices.  Limit the intake of caffeine, alcohol and Soda.  Don't exercise too soon after eating.  Don't lie down within 3-4 hours of eating.  Elevate the head of your bed.   You have been scheduled for an endoscopy. Please follow written instructions given to you at your visit today. If you use inhalers (even only as needed), please bring them with you on the day of your procedure. Your physician has requested that you go to www.startemmi.com and enter the access code given to you at your visit today. This web site gives a general overview about your procedure. However, you should still follow specific instructions given to you by our office regarding your preparation for the procedure.  Thank you for choosing me and [facility].  Pricilla Riffle. Dagoberto Ligas., MD., Marval Regal

## 2014-05-22 ENCOUNTER — Encounter: Payer: Self-pay | Admitting: Gastroenterology

## 2014-05-22 ENCOUNTER — Ambulatory Visit (AMBULATORY_SURGERY_CENTER): Payer: BLUE CROSS/BLUE SHIELD | Admitting: Gastroenterology

## 2014-05-22 VITALS — BP 117/70 | HR 68 | Temp 98.1°F | Resp 13 | Ht 67.0 in | Wt 181.0 lb

## 2014-05-22 DIAGNOSIS — K222 Esophageal obstruction: Secondary | ICD-10-CM

## 2014-05-22 DIAGNOSIS — R1310 Dysphagia, unspecified: Secondary | ICD-10-CM

## 2014-05-22 DIAGNOSIS — R1319 Other dysphagia: Secondary | ICD-10-CM

## 2014-05-22 DIAGNOSIS — K219 Gastro-esophageal reflux disease without esophagitis: Secondary | ICD-10-CM

## 2014-05-22 DIAGNOSIS — R1314 Dysphagia, pharyngoesophageal phase: Secondary | ICD-10-CM

## 2014-05-22 MED ORDER — SODIUM CHLORIDE 0.9 % IV SOLN: 500.0000 mL | INTRAVENOUS | Status: DC

## 2014-05-22 NOTE — Op Note (Signed)
[place]  [employer] [place], [address]   ENDOSCOPY PROCEDURE REPORT  PATIENT: Stephen Bryant, Stephen Bryant  MR#: [id number] BIRTHDATE: [birth year] , 59  yrs. old GENDER: male ENDOSCOPIST: Ladene Artist, MD, [facility] PROCEDURE DATE:  05/22/2014 PROCEDURE:  EGD w/ wire guided (savary) dilation ASA CLASS:     Class II INDICATIONS:  dysphagia and history of esophageal reflux. MEDICATIONS: Monitored anesthesia care and Propofol 270 mg IV TOPICAL ANESTHETIC: none DESCRIPTION OF PROCEDURE: After the risks benefits and alternatives of the procedure were thoroughly explained, informed consent was obtained.  The LB [id number] endoscope was introduced through the mouth and advanced to the second portion of the duodenum , Without limitations.  The instrument was slowly withdrawn as the mucosa was fully examined.    ESOPHAGUS: There was LA Class B esophagitis (One or more mucosal breaks > 12m, but without continuity across mucosal folds) noted. There was a short benign appearing stricture, with an inner diameter of 133m at the gastroesophageal junction.  The stricture was easily traversable.  The stricture was dilated using a 1421m69m3md 16mm46mary dilators over guidewire.  Minimal resistance and no heme noted. STOMACH:   The mucosa and folds of the stomach appeared normal. DUODENUM: The duodenal mucosa showed no abnormalities in the bulb and 2nd part of the duodenum.  Retroflexed views revealed a small hiatal hernia.  The scope was then withdrawn from the patient and the procedure completed.  COMPLICATIONS: There were no immediate complications.  ENDOSCOPIC IMPRESSION: 1.   LA Class B esophagitis 2.   Stricture at the gastroesophageal junction; dilated using savary dilator over guidewire 3.   Small hiatal hernia  RECOMMENDATIONS: 1.  Anti-reflux regimen 2.  Continue PPI 3.  Post dilation instructions 4.  OP follow-up in 4 weeks  eSigned:  MalcoLadene Artist, FACG [facility]2/2016 8:24 AM

## 2014-05-22 NOTE — Patient Instructions (Signed)
YOU HAD AN ENDOSCOPIC PROCEDURE TODAY AT [facility]:   Refer to the procedure report that was given to you for any specific questions about what was found during the examination.  If the procedure report does not answer your questions, please call your gastroenterologist to clarify.  If you requested that your care partner not be given the details of your procedure findings, then the procedure report has been included in a sealed envelope for you to review at your convenience later.  YOU SHOULD EXPECT: Some feelings of bloating in the abdomen. Passage of more gas than usual.  Walking can help get rid of the air that was put into your GI tract during the procedure and reduce the bloating. If you had a lower endoscopy (such as a colonoscopy or flexible sigmoidoscopy) you may notice spotting of blood in your stool or on the toilet paper. If you underwent a bowel prep for your procedure, you may not have a normal bowel movement for a few days.  Please Note:  You might notice some irritation and congestion in your nose or some drainage.  This is from the oxygen used during your procedure.  There is no need for concern and it should clear up in a day or so.  SYMPTOMS TO REPORT IMMEDIATELY:    Following upper endoscopy (EGD)  Vomiting of blood or coffee ground material  New chest pain or pain under the shoulder blades  Painful or persistently difficult swallowing  New shortness of breath  Fever of 100F or higher  Black, tarry-looking stools  For urgent or emergent issues, a gastroenterologist can be reached at any hour by calling [phone].   DIET:  Clear liquids at 0930. If tolerated, you may proceed to a soft diet for the rest of today.  Regular diet tomorrow. Likewise, meals heavy in dairy and vegetables can increase bloating.  Drink plenty of fluids but you should avoid alcoholic beverages for 24 hours.  ACTIVITY:  You should plan to take it easy for the rest of today and  you should NOT DRIVE or use heavy machinery until tomorrow (because of the sedation medicines used during the test).    FOLLOW UP: Our staff will call the number listed on your records the next business day following your procedure to check on you and address any questions or concerns that you may have regarding the information given to you following your procedure. If we do not reach you, we will leave a message.  However, if you are feeling well and you are not experiencing any problems, there is no need to return our call.  We will assume that you have returned to your regular daily activities without incident.  If any biopsies were taken you will be contacted by phone or by letter within the next 1-3 weeks.  Please call us at [phone] if you have not heard about the biopsies in 3 weeks.    SIGNATURES/CONFIDENTIALITY: You and/or your care partner have signed paperwork which will be entered into your electronic medical record.  These signatures attest to the fact that that the information above on your After Visit Summary has been reviewed and is understood.  Full responsibility of the confidentiality of this discharge information lies with you and/or your care-partner.  Be sure to take your PPI, and read all of the handouts given to you by your recovery room nurse.

## 2014-05-22 NOTE — Progress Notes (Signed)
Called to room to assist during endoscopic procedure.  Patient ID and intended procedure confirmed with present staff. Received instructions for my participation in the procedure from the performing physician.  

## 2014-05-22 NOTE — Progress Notes (Signed)
Procedure ends, to recovery, report given, VSS.

## 2014-05-23 ENCOUNTER — Telehealth: Payer: Self-pay | Admitting: *Deleted

## 2014-05-23 NOTE — Telephone Encounter (Signed)
  Follow up Call-  Call back number 05/22/2014 11/15/2013  Post procedure Call Back phone  # [phone] cell  Permission to leave phone message Yes Yes     Patient questions:  Do you have a fever, pain , or abdominal swelling? No. Pain Score  0 *  Have you tolerated food without any problems? Yes.    Have you been able to return to your normal activities? Yes.    Do you have any questions about your discharge instructions: Diet   No. Medications  No. Follow up visit  No.  Do you have questions or concerns about your Care? No.  Actions: * If pain score is 4 or above: No action needed, pain <4.

## 2014-06-25 ENCOUNTER — Telehealth: Payer: Self-pay | Admitting: Internal Medicine

## 2014-06-25 NOTE — Telephone Encounter (Signed)
Patient had an endoscopy with Dr. Fuller Plan about a month ago and has had a persistent sore throat since then. He has had a cold since then and even went to urgent care and was treated with antibiotics, but the sore throat still remains. He is wondering if he should come in to see Dr. Linna Darner or if he should make an appointment with Dr. Fuller Plan about this. Please advise

## 2014-06-25 NOTE — Telephone Encounter (Signed)
Patient has been advised

## 2014-06-25 NOTE — Telephone Encounter (Signed)
Zicam Melts or Zinc lozenges as per package label for sore throat .OV here if no better

## 2014-06-27 ENCOUNTER — Encounter: Payer: Self-pay | Admitting: Internal Medicine

## 2014-06-27 ENCOUNTER — Other Ambulatory Visit (INDEPENDENT_AMBULATORY_CARE_PROVIDER_SITE_OTHER): Payer: BLUE CROSS/BLUE SHIELD

## 2014-06-27 ENCOUNTER — Ambulatory Visit (INDEPENDENT_AMBULATORY_CARE_PROVIDER_SITE_OTHER): Payer: BLUE CROSS/BLUE SHIELD | Admitting: Internal Medicine

## 2014-06-27 VITALS — BP 134/100 | HR 75 | Temp 98.1°F | Ht 67.0 in | Wt 178.0 lb

## 2014-06-27 DIAGNOSIS — R233 Spontaneous ecchymoses: Secondary | ICD-10-CM

## 2014-06-27 DIAGNOSIS — K2100 Gastro-esophageal reflux disease with esophagitis, without bleeding: Secondary | ICD-10-CM

## 2014-06-27 DIAGNOSIS — J310 Chronic rhinitis: Secondary | ICD-10-CM | POA: Diagnosis not present

## 2014-06-27 DIAGNOSIS — R238 Other skin changes: Secondary | ICD-10-CM

## 2014-06-27 DIAGNOSIS — J029 Acute pharyngitis, unspecified: Secondary | ICD-10-CM

## 2014-06-27 LAB — CBC WITH DIFFERENTIAL/PLATELET
Basophils Absolute: 0.1 10*3/uL (ref 0.0–0.1)
Basophils Relative: 0.8 % (ref 0.0–3.0)
EOS PCT: 2.5 % (ref 0.0–5.0)
Eosinophils Absolute: 0.2 10*3/uL (ref 0.0–0.7)
HEMATOCRIT: 43.6 % (ref 39.0–52.0)
HEMOGLOBIN: 14.9 g/dL (ref 13.0–17.0)
LYMPHS ABS: 2.3 10*3/uL (ref 0.7–4.0)
Lymphocytes Relative: 34.6 % (ref 12.0–46.0)
MCHC: 34.2 g/dL (ref 30.0–36.0)
MCV: 88.1 fl (ref 78.0–100.0)
MONOS PCT: 12.4 % — AB (ref 3.0–12.0)
Monocytes Absolute: 0.8 10*3/uL (ref 0.1–1.0)
NEUTROS ABS: 3.3 10*3/uL (ref 1.4–7.7)
Neutrophils Relative %: 49.7 % (ref 43.0–77.0)
Platelets: 326 10*3/uL (ref 150.0–400.0)
RBC: 4.95 Mil/uL (ref 4.22–5.81)
RDW: 13.8 % (ref 11.5–15.5)
WBC: 6.7 10*3/uL (ref 4.0–10.5)

## 2014-06-27 NOTE — Progress Notes (Signed)
   Subjective:    Patient ID: Stephen Bryant, male    DOB: [birth year], 64 y.o.   MRN: [id number]  HPI He had an upper endoscopy 05/22/14. This revealed LA class B esophagitis ; esophageal stricture as well as a small hiatal hernia. He did have dilation of the stricture. He was placed on omeprazole.  2 days after the procedure he began to have a sore throat .It did improve but reccurred after he was exposed to his wife's acute respiratory infection. He also developed rhinitis. This subsequently resolved as well.  He was on the protein pump inhibitor as well as an agent for colitis and developed burning of his tongue and dry mouth. This occurred approximately 10 days ago .He stopped all medications. That symptom complex resolved. He continues to have a sore throat.  He denies symptoms of an upper respiratory tract infection or significant GI symptoms despite the dramatic endoscopy findings and being off the PPI.  He's concerned because he does have easy bruising. He has no other bleeding dyscrasias.   Review of Systems Frontal headache, facial pain , nasal purulence, dental pain, sore throat , otic pain or otic discharge denied. No fever , chills or sweats.Unexplained weight loss, abdominal pain, significant dyspepsia, dysphagia, melena, rectal bleeding, or persistently small caliber stools are denied. Epistaxis, hemoptysis, or hematuria denied. There is no abnormal bleeding, or difficulty stopping bleeding with injury.    Objective:   Physical Exam  Pertinent or positive findings include:  There is marked erythema of the nasal septum without associated exudate.  There is also some inflammation and slight vesicle formation over the uvula.  Heart sounds are distant.  He has an ecchymotic lesion measuring 2 x 2 cm over the right forearm.  General appearance :adequately nourished; in no distress. Eyes: No conjunctival inflammation or scleral icterus is present. Oral exam:  Lips and gums are  healthy appearing.There is no oropharyngeal exudate noted. Dental hygiene is good. Heart:  Normal rate and regular rhythm. S1 and S2 normal without gallop, murmur, click, rub or other extra sounds   Lungs:Chest clear to auscultation; no wheezes, rhonchi,rales ,or rubs present.No increased work of breathing.  Abdomen: bowel sounds normal, soft and non-tender without masses, organomegaly or hernias noted.  No guarding or rebound.  Vascular : all pulses equal ; no bruits present. Skin:Warm & dry.  Intact without suspicious lesions or rashes ; no tenting or jaundice  Lymphatic: No lymphadenopathy is noted about the head, neck, axilla Neuro: Strength, tone & DTRs normal.       Assessment & Plan:  #1 persistent sore throat in the context #2 and #3  #2 significant GERD with LA class B esophagitis and esophageal stricture. Now off PPI  #3 nonallergic rhinitis  #4 easy bruising  See orders and after visit summary.

## 2014-06-27 NOTE — Progress Notes (Signed)
Pre visit review using our clinic review tool, if applicable. No additional management support is needed unless otherwise documented below in the visit note. 

## 2014-06-27 NOTE — Patient Instructions (Signed)
Nasal cleansing in the shower as discussed with lather of mild shampoo.After 10 seconds wash off lather while  exhaling through nostrils. Make sure that all residual soap is removed to prevent irritation.  Flonase OR Nasacort AQ 1 spray in each nostril twice a day as needed. Use the "crossover" technique into opposite nostril spraying toward opposite ear @ 45 degree angle, not straight up into nostril.  Plain Allegra (NOT D )  160 daily , Loratidine 10 mg , OR Zyrtec 10 mg @ bedtime  as needed for itchy eyes & sneezing.Reflux of gastric acid may be asymptomatic as this may occur mainly during sleep.The triggers for reflux  include stress; the "aspirin family" ; alcohol; peppermint; and caffeine (coffee, tea, cola, and chocolate). The aspirin family would include aspirin and the nonsteroidal agents such as ibuprofen &  Naproxen. Tylenol would not cause reflux. If having symptoms ; food & drink should be avoided for @ least 2 hours before going to bed. Take the protein pump inhibitor Omeprazole 30 minutes before breakfast .

## 2014-07-24 ENCOUNTER — Telehealth: Payer: Self-pay | Admitting: Gastroenterology

## 2014-07-24 NOTE — Telephone Encounter (Signed)
Patient will come in and see Dr. Fuller Plan on 08/01/14 at 3:00.  He is aware of the appt date and time

## 2014-07-24 NOTE — Telephone Encounter (Signed)
Left message for patient to call back  

## 2014-08-01 ENCOUNTER — Encounter: Payer: Self-pay | Admitting: Gastroenterology

## 2014-08-01 ENCOUNTER — Ambulatory Visit (INDEPENDENT_AMBULATORY_CARE_PROVIDER_SITE_OTHER): Payer: BLUE CROSS/BLUE SHIELD | Admitting: Gastroenterology

## 2014-08-01 VITALS — BP 122/68 | HR 89 | Ht 67.75 in | Wt 179.2 lb

## 2014-08-01 DIAGNOSIS — J029 Acute pharyngitis, unspecified: Secondary | ICD-10-CM

## 2014-08-01 DIAGNOSIS — K2100 Gastro-esophageal reflux disease with esophagitis, without bleeding: Secondary | ICD-10-CM

## 2014-08-01 MED ORDER — OMEPRAZOLE 40 MG PO CPDR: 40.0000 mg | DELAYED_RELEASE_CAPSULE | Freq: Two times a day (BID) | ORAL | Status: DC

## 2014-08-01 NOTE — Patient Instructions (Signed)
We have sent the following medications to your pharmacy for you to pick up at your convenience:omeprazole 40 mg one tablet by mouth twice daily.   Your follow up appointment with Dr. Fuller Plan is on 09/10/14 at 10:30am.   Thank you for choosing me and [facility].  Pricilla Riffle. Dagoberto Ligas., MD., Mar[facility] Regal

## 2014-08-01 NOTE — Progress Notes (Signed)
Patient ID: Stephen Bryant, male   DOB: 1950/03/04, 64 y.o.   MRN: [id number]     History of Present Illness: This is a 64 year old male complaining of a persistent sore throat. He underwent upper endoscopy for GERD and dysphagia in March 2016. LA class grade B esophagitis and esophageal stricture were noted. The stricture was dilated to 16 mm with Savary dilators. He's been maintained on omeprazole 20 mg daily and notes no heartburn regurgitation or dysphagia. Following endoscopy he noted a mild sore throat for a few days that seemed to improve but then has persisted. He's been evaluated in an urgent care center, by Dr. Linna Darner (PCP) and by Dr. Wilburn Cornelia (ENT). He was treated empirically for oral candidiasis with no change in symptoms.  Current Medications, Allergies, Past Medical History, Past Surgical History, Family History and Social History were reviewed in [employer] record.  Physical Exam: General: Well developed , well nourished, no acute distress Head: Normocephalic and atraumatic Eyes:  sclerae anicteric, EOMI Ears: Normal auditory acuity Mouth: No deformity or lesions Lungs: Clear throughout to auscultation Heart: Regular rate and rhythm; no murmurs, rubs or bruits Abdomen: Soft, non tender and non distended. No masses, hepatosplenomegaly or hernias noted. Normal Bowel sounds Musculoskeletal: Symmetrical with no gross deformities  Pulses:  Normal pulses noted Extremities: No clubbing, cyanosis, edema or deformities noted Neurological: Alert oriented x 4, grossly nonfocal Psychological:  Alert and cooperative. Normal mood and affect  Assessment and Recommendations:  1. Sore throat started after his EGD. His reflux symptoms appear to be well controlled. It is possible that the sore throat is related to reflux but other etiologies need to be considered including sinus drainage, allergies, etc. Advised to follow up with his PCP. GERD with LA class B erosive  esophagitis and an esophageal stricture. Trial of increasing omeprazole to 40 mg twice daily for 1 month and reassess symptoms. REV in one month.

## 2014-09-10 ENCOUNTER — Ambulatory Visit: Payer: BLUE CROSS/BLUE SHIELD | Admitting: Gastroenterology

## 2014-11-27 ENCOUNTER — Ambulatory Visit (INDEPENDENT_AMBULATORY_CARE_PROVIDER_SITE_OTHER): Payer: BLUE CROSS/BLUE SHIELD | Admitting: Family Medicine

## 2014-11-27 ENCOUNTER — Other Ambulatory Visit (INDEPENDENT_AMBULATORY_CARE_PROVIDER_SITE_OTHER): Payer: BLUE CROSS/BLUE SHIELD

## 2014-11-27 ENCOUNTER — Encounter: Payer: Self-pay | Admitting: Family Medicine

## 2014-11-27 VITALS — BP 130/84 | HR 80 | Ht 67.5 in | Wt 187.0 lb

## 2014-11-27 DIAGNOSIS — S4992XD Unspecified injury of left shoulder and upper arm, subsequent encounter: Secondary | ICD-10-CM

## 2014-11-27 DIAGNOSIS — M25512 Pain in left shoulder: Secondary | ICD-10-CM

## 2014-11-27 NOTE — Assessment & Plan Note (Signed)
Patient is doing remarkably well at this time. We could do another injection again. We discussed icing regimen and doing the home exercises on a more regular basis. Patient will use over-the-counter anti-inflammatory as if needed. Lungs patient does well he can follow-up as needed. We can repeat injections every 3 weeks if necessary.

## 2014-11-27 NOTE — Patient Instructions (Signed)
Good to see you Ice 20 minutes 2 times daily. Usually after activity and before bed. Stay active but avoid overhead activity with heavy lifting.  See me when you need me.

## 2014-11-27 NOTE — Progress Notes (Signed)
Pre visit review using our clinic review tool, if applicable. No additional management support is needed unless otherwise documented below in the visit note. 

## 2014-11-27 NOTE — Progress Notes (Signed)
[name] [place], [place] [address] Phone: [phone] Subjective:    I'm seeing this patient by the request  of:  Unice Cobble, MD   CC: Left shoulder pain follow up  ACZ:[id number] Stephen Bryant is a 64 y.o. male coming in with complaint of left shoulder pain. Patient was last seen in January for an acromioclavicular arthritis. Patient was given an injection and did very well. Patient states he is doing very well until last 2 weeks. Patient was moving some boxes from the attic and had to lift them above his head. In the seem to exacerbate the problem. Same area where it is more pain on the anterior aspect of the shoulder. Worse with overhead activity. Denies any loss of strength. Worse with crossing over his body as well.    Past medical history, social, surgical and family history all reviewed in electronic medical record.   Review of Systems: No headache, visual changes, nausea, vomiting, diarrhea, constipation, dizziness, abdominal pain, skin rash, fevers, chills, night sweats, weight loss, swollen lymph nodes, body aches, joint swelling, muscle aches, chest pain, shortness of breath, mood changes.   Objective Blood pressure 130/84, pulse 80, height 5' 7.5" (1.715 m), weight 187 lb (84.823 kg), SpO2 97 %.  General: No apparent distress alert and oriented x3 mood and affect normal, dressed appropriately.  HEENT: Pupils equal, extraocular movements intact  Respiratory: Patient's speak in full sentences and does not appear short of breath  Cardiovascular: No lower extremity edema, non tender, no erythema  Skin: Warm dry intact with no signs of infection or rash on extremities or on axial skeleton.  Abdomen: Soft nontender  Neuro: Cranial nerves II through XII are intact, neurovascularly intact in all extremities with 2+ DTRs and 2+ pulses.  Lymph: No lymphadenopathy of posterior or anterior cervical chain or axillae bilaterally.  Gait  normal with good balance and coordination.  MSK:  Non tender with full range of motion and good stability and symmetric strength and tone of elbows, wrist, hip, knee and ankles bilaterally.  Neck: Inspection unremarkable. No palpable stepoffs. Negative Spurling's maneuver. Mild decreased range of motion in all planes Grip strength and sensation normal in bilateral hands Strength good C4 to T1 distribution No sensory change to C4 to T1 Negative Hoffman sign bilaterally Reflexes normal Shoulder: left Inspection reveals no abnormalities, atrophy or asymmetry. Mild tenderness over the acromioclavicular joint ROM is full in all planes passively. Rotator cuff strength normal throughout. signs of impingement with positive Neer and Hawkin's tests, but negative empty can sign still significantly better than previous exam Speeds and Yergason's tests normal. Negative crossover sign Normal scapular function observed. No painful arc and no drop arm sign. No apprehension sign Positive crossover sign Contralateral shoulder unremarkable  Procedure: Real-time Ultrasound Guided Injection of left acromial clavicular Device: GE Logiq E  Ultrasound guided injection is preferred based studies that show increased duration, increased effect, greater accuracy, decreased procedural pain, increased response rate with ultrasound guided versus blind injection.  Verbal informed consent obtained.  Time-out conducted.  Noted no overlying erythema, induration, or other signs of local infection.  Skin prepped in a sterile fashion.  Local anesthesia: Topical Ethyl chloride.  With sterile technique and under real time ultrasound guidance:  Joint visualized.  25g 1 inch needle inserted posterior approach. Pictures taken for needle placement. Patient did have injection of 0.5 cc of 0.5% Marcaine, and 0.5cc of Kenalog 40 mg/dL. Completed without difficulty  Pain  immediately resolved suggesting accurate placement of the  medication.  Advised to call if fevers/chills, erythema, induration, drainage, or persistent bleeding.  Images permanently stored and available for review in the ultrasound unit.  Impression: Technically successful ultrasound guided injection.     Impression and Recommendations:     This case required medical decision making of moderate complexity.

## 2014-12-26 ENCOUNTER — Encounter: Payer: Self-pay | Admitting: Family Medicine

## 2014-12-26 ENCOUNTER — Ambulatory Visit (INDEPENDENT_AMBULATORY_CARE_PROVIDER_SITE_OTHER): Payer: BLUE CROSS/BLUE SHIELD | Admitting: Family Medicine

## 2014-12-26 VITALS — BP 130/82 | HR 65 | Ht 67.5 in | Wt 184.0 lb

## 2014-12-26 DIAGNOSIS — M7552 Bursitis of left shoulder: Secondary | ICD-10-CM

## 2014-12-26 NOTE — Assessment & Plan Note (Signed)
Patient was given an injection today and tolerated the procedure very well. Patient will continue conservative therapy including home exercises which will went over in greater detail. We discussed postural changes. Patient and will come back and see me again in 3-4 weeks for further evaluation and treatment.

## 2014-12-26 NOTE — Progress Notes (Signed)
[facility] [address] [place], [place] [address] Phone: [phone] Subjective:    I'm seeing this patient by the request  of:  Unice Cobble, MD   CC: Left shoulder pain follow up  EEF:[id number] Stephen Bryant is a 64 y.o. male coming in with complaint of left shoulder pain. Patient was last seen in January for an acromioclavicular arthritis. Patient had injection in the acromial clavicular joint at last exam but states that it did not make any significant improvement. Has noticed that with some more increasing activity is been doing outside the more discomfort there is. No radiation down the arm but does notice significant tightness in the morning. Hasn't stopped him from doing activities but makes it very difficult.    Past medical history, social, surgical and family history all reviewed in electronic medical record.   Review of Systems: No headache, visual changes, nausea, vomiting, diarrhea, constipation, dizziness, abdominal pain, skin rash, fevers, chills, night sweats, weight loss, swollen lymph nodes, body aches, joint swelling, muscle aches, chest pain, shortness of breath, mood changes.   Objective There were no vitals taken for this visit.  General: No apparent distress alert and oriented x3 mood and affect normal, dressed appropriately.  HEENT: Pupils equal, extraocular movements intact  Respiratory: Patient's speak in full sentences and does not appear short of breath  Cardiovascular: No lower extremity edema, non tender, no erythema  Skin: Warm dry intact with no signs of infection or rash on extremities or on axial skeleton.  Abdomen: Soft nontender  Neuro: Cranial nerves II through XII are intact, neurovascularly intact in all extremities with 2+ DTRs and 2+ pulses.  Lymph: No lymphadenopathy of posterior or anterior cervical chain or axillae bilaterally.  Gait normal with good balance and coordination.  MSK:  Non tender with full  range of motion and good stability and symmetric strength and tone of elbows, wrist, hip, knee and ankles bilaterally.  Neck: Inspection unremarkable. No palpable stepoffs. Negative Spurling's maneuver. Mild decreased range of motion in all planes Grip strength and sensation normal in bilateral hands Strength good C4 to T1 distribution No sensory change to C4 to T1 Negative Hoffman sign bilaterally Reflexes normal Shoulder: left Inspection reveals no abnormalities, atrophy or asymmetry. Mild tenderness over the acromioclavicular joint ROM is full in all planes passively. Rotator cuff strength normal throughout. signs of impingement with positive Neer and Hawkin's tests, but negative empty can  Speeds and Yergason's tests normal. Negative crossover sign Normal scapular function observed. No painful arc and no drop arm sign. No apprehension sign Positive crossover sign Contralateral shoulder unremarkable  Procedure: Real-time Ultrasound Guided Injection of left glenohumeral joint Device: GE Logiq E  Ultrasound guided injection is preferred based studies that show increased duration, increased effect, greater accuracy, decreased procedural pain, increased response rate with ultrasound guided versus blind injection.  Verbal informed consent obtained.  Time-out conducted.  Noted no overlying erythema, induration, or other signs of local infection.  Skin prepped in a sterile fashion.  Local anesthesia: Topical Ethyl chloride.  With sterile technique and under real time ultrasound guidance:  Joint visualized.  23g 1  inch needle inserted posterior approach. Pictures taken for needle placement. Patient did have injection of 2 cc of 1% lidocaine, 2 cc of 0.5% Marcaine, and 1cc of Kenalog 40 mg/dL. Completed without difficulty  Pain immediately resolved suggesting accurate placement of the medication.  Advised to call if fevers/chills, erythema, induration, drainage, or persistent  bleeding.  Images permanently stored and available for review in the ultrasound unit.  Impression: Technically successful ultrasound guided injection.      Impression and Recommendations:     This case required medical decision making of moderate complexity.

## 2014-12-26 NOTE — Patient Instructions (Addendum)
Good to see you Ice is your friend Home exercises still 3 times a week.  Consider a zyrtec at night See me again i n3-4 weeks if not perfect.

## 2014-12-26 NOTE — Progress Notes (Signed)
Pre visit review using our clinic review tool, if applicable. No additional management support is needed unless otherwise documented below in the visit note. 

## 2015-02-12 ENCOUNTER — Telehealth: Payer: Self-pay

## 2015-02-12 IMAGING — CR DG CERVICAL SPINE COMPLETE 4+V
5 series · 5 of 5 positions shown · non-contrast
Comparison: None.

CLINICAL DATA: Left shoulder pain for 3 weeks, left neck
discomfort, no injury

EXAM:
CERVICAL SPINE  4+ VIEWS

[view not recorded (1 of 5)]
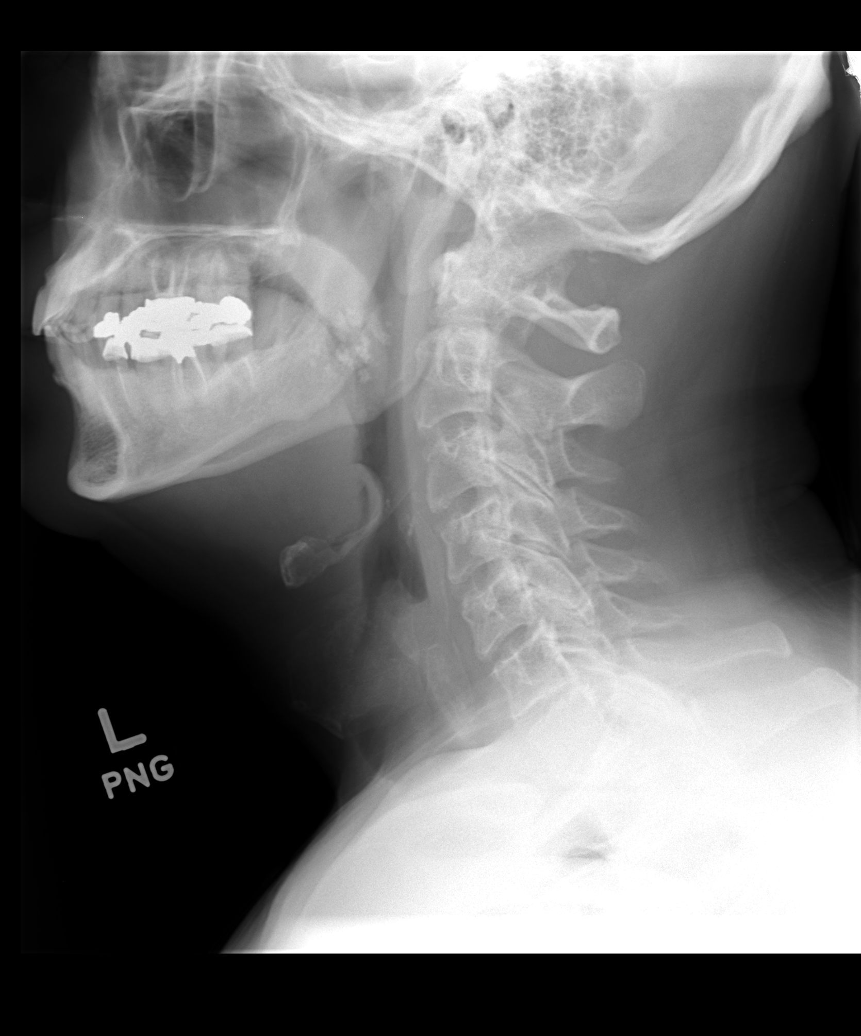

[view not recorded (2 of 5)]
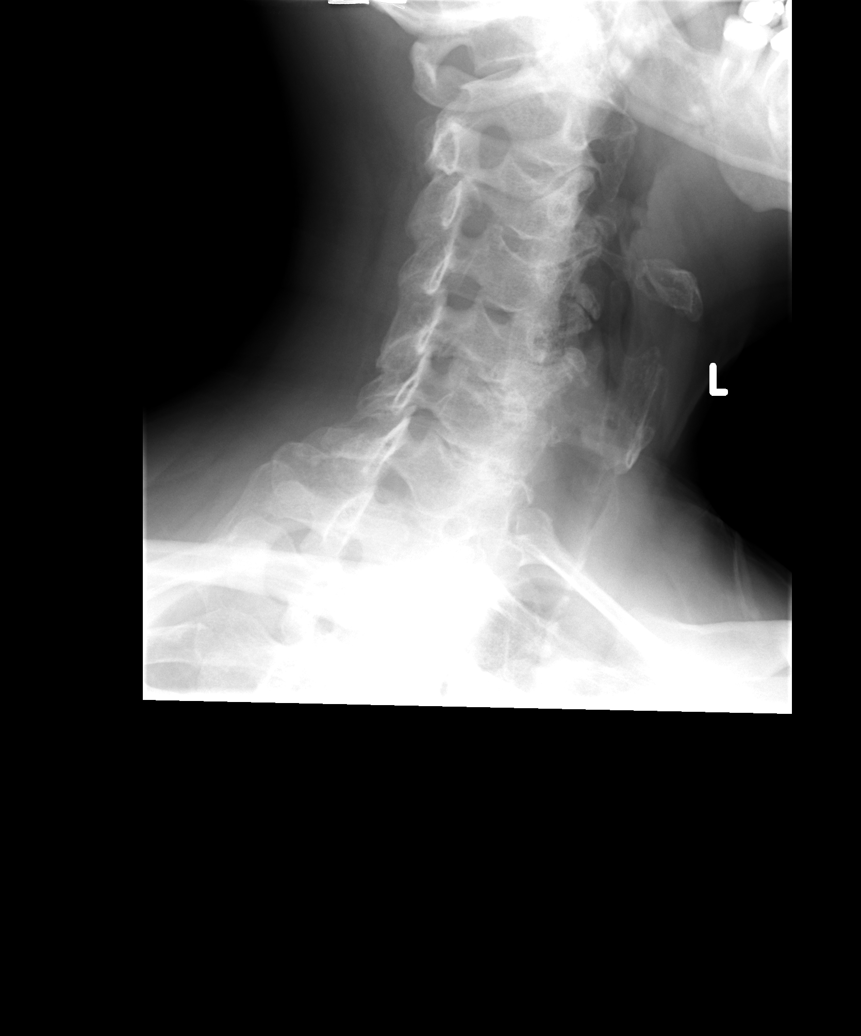

[view not recorded (3 of 5)]
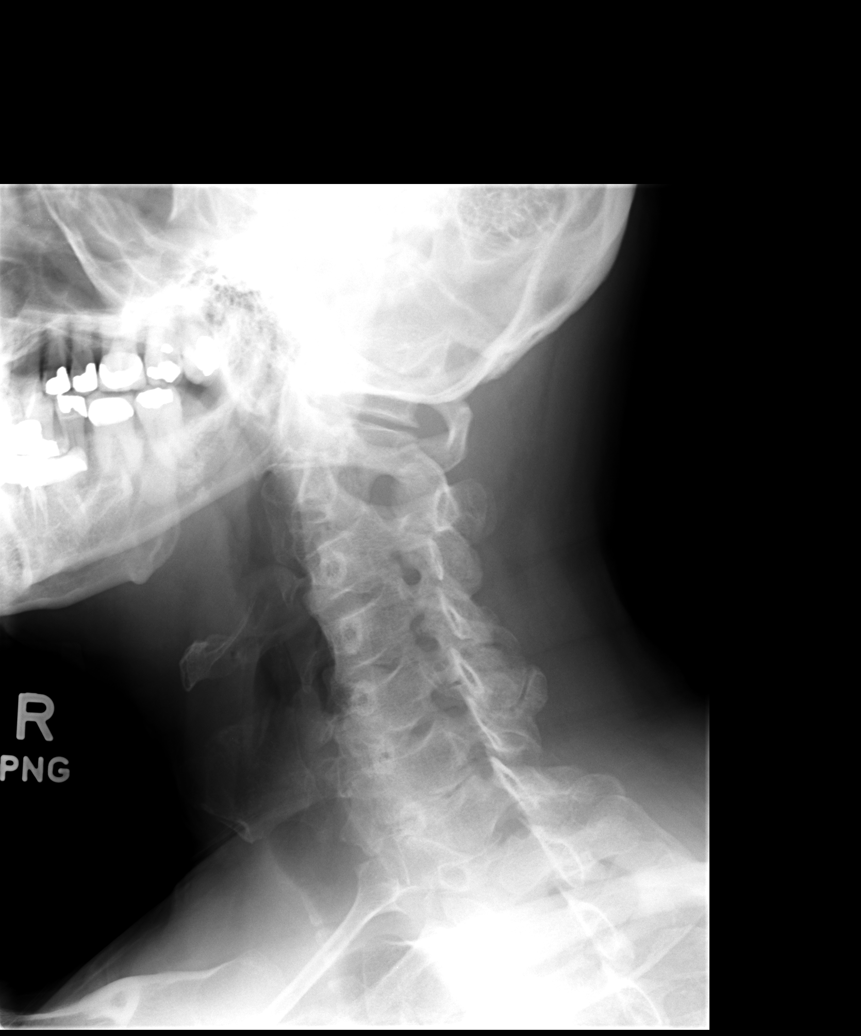

[view not recorded (4 of 5)]
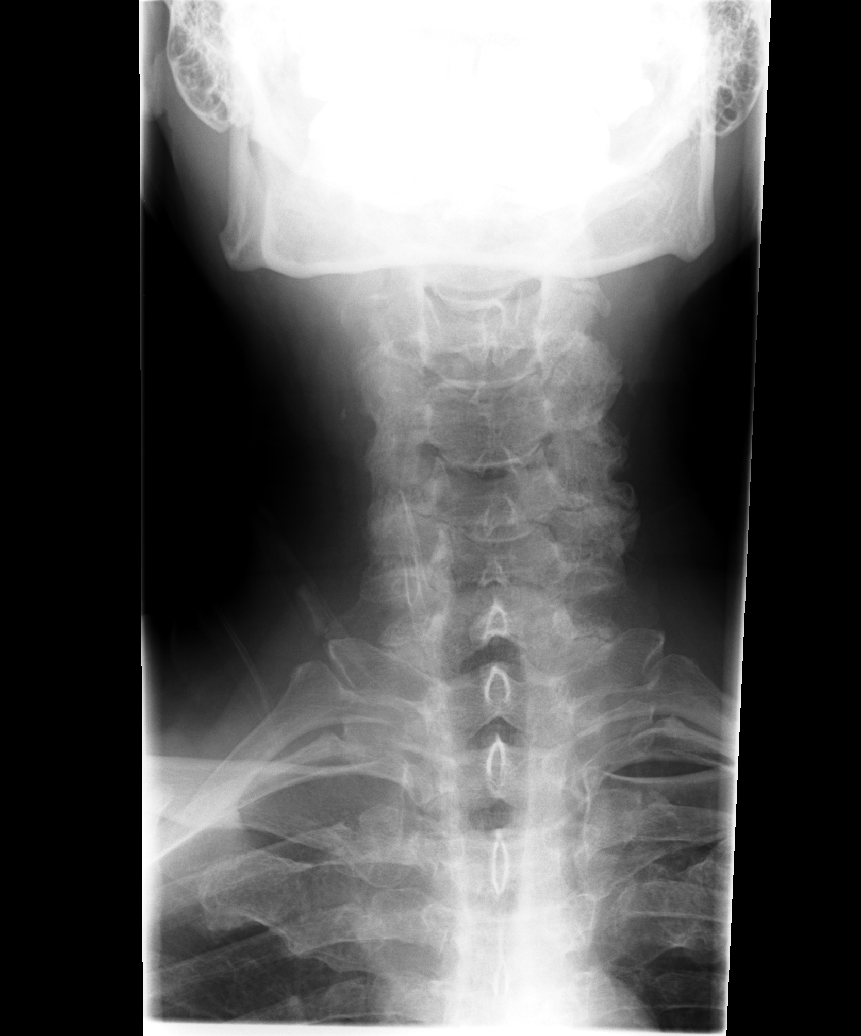

[view not recorded (5 of 5)]
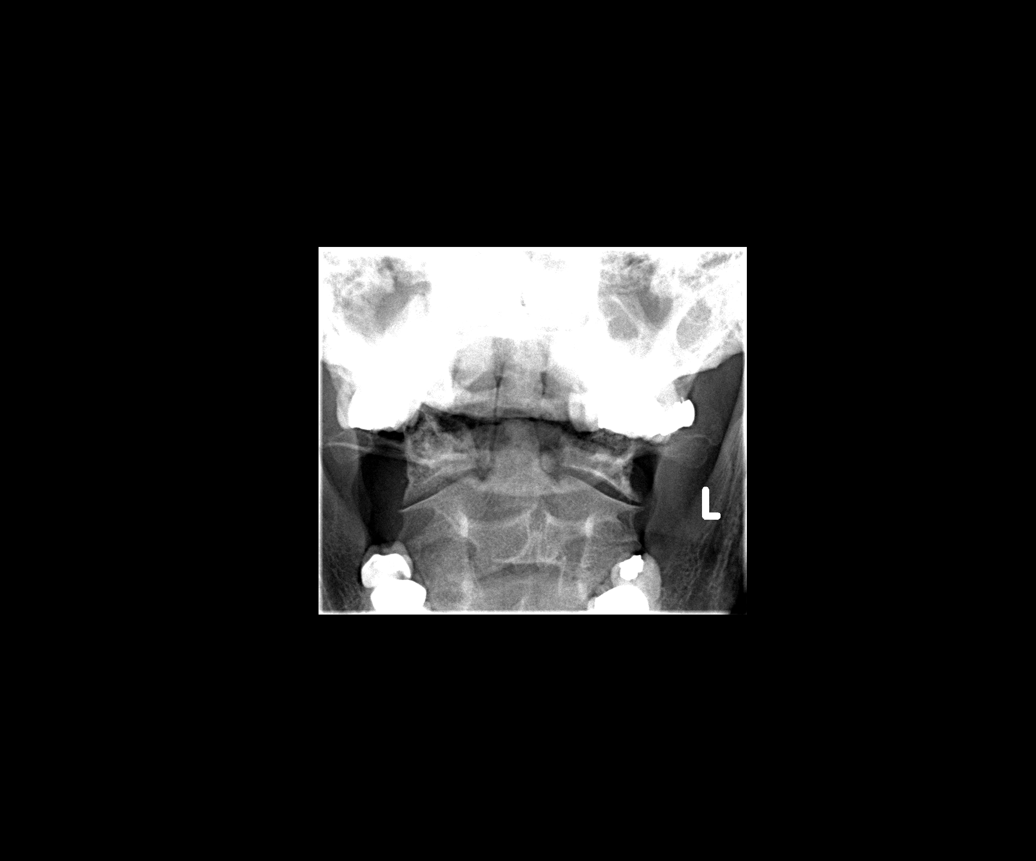

[5 of 5 positions shown; findings below may reference images not displayed]

FINDINGS: The cervical vertebrae are in normal alignment. There is mild
degenerative disc disease at C6-7 where there is slight loss of disc
space and mild sclerosis. No prevertebral soft tissue swelling is
seen. On oblique views, no significant foraminal narrowing is seen.
The odontoid process is intact. The lung apices are clear.

On the lateral view of the cervical spine, there are calcifications
overlying the mandibular angles. These could represent
calcifications within the parotid gland and clinical correlation is
recommended. If further assessment is warranted CT of the
maxillofacial region may be helpful.
IMPRESSION: 1. Normal alignment with mild degenerative disc disease at C6-7. No
foraminal narrowing.
2. Calcifications on the lateral view overlie the region of the
parotid glands and may represent parotid calculi. Consider CT
maxillofacial if further assessment is warranted clinically.

## 2015-02-12 NOTE — Telephone Encounter (Signed)
Left Voice Mail for pt to call back.   RE: Flu Vaccine for 2016

## 2015-06-04 ENCOUNTER — Other Ambulatory Visit: Payer: Self-pay

## 2015-06-04 MED ORDER — OMEPRAZOLE 40 MG PO CPDR: 40.0000 mg | DELAYED_RELEASE_CAPSULE | Freq: Two times a day (BID) | ORAL | Status: DC

## 2015-11-23 ENCOUNTER — Other Ambulatory Visit: Payer: Self-pay | Admitting: Gastroenterology

## 2016-03-02 DIAGNOSIS — I10 Essential (primary) hypertension: Secondary | ICD-10-CM

## 2016-03-02 HISTORY — DX: Essential (primary) hypertension: I10

## 2016-06-25 ENCOUNTER — Ambulatory Visit (INDEPENDENT_AMBULATORY_CARE_PROVIDER_SITE_OTHER): Payer: BLUE CROSS/BLUE SHIELD | Admitting: Family Medicine

## 2016-06-25 ENCOUNTER — Encounter: Payer: Self-pay | Admitting: Family Medicine

## 2016-06-25 VITALS — BP 160/108 | HR 61 | Temp 97.8°F | Resp 16 | Ht 68.0 in | Wt 191.0 lb

## 2016-06-25 DIAGNOSIS — R030 Elevated blood-pressure reading, without diagnosis of hypertension: Secondary | ICD-10-CM | POA: Diagnosis not present

## 2016-06-25 DIAGNOSIS — Z23 Encounter for immunization: Secondary | ICD-10-CM

## 2016-06-25 DIAGNOSIS — Z8639 Personal history of other endocrine, nutritional and metabolic disease: Secondary | ICD-10-CM

## 2016-06-25 DIAGNOSIS — Z0000 Encounter for general adult medical examination without abnormal findings: Secondary | ICD-10-CM | POA: Diagnosis not present

## 2016-06-25 DIAGNOSIS — Z125 Encounter for screening for malignant neoplasm of prostate: Secondary | ICD-10-CM

## 2016-06-25 MED ORDER — OMEPRAZOLE 40 MG PO CPDR: 40.0000 mg | DELAYED_RELEASE_CAPSULE | Freq: Every day | ORAL | 12 refills | Status: DC

## 2016-06-25 NOTE — Patient Instructions (Signed)
Buy blood pressure cuff (upper arm cuff, automated) and check bp morning and evening for the next 2 wks. Bring these numbers in with you to your next f/u with me in 2 weeks.

## 2016-06-25 NOTE — Progress Notes (Signed)
Pre visit review using our clinic review tool, if applicable. No additional management support is needed unless otherwise documented below in the visit note. 

## 2016-06-25 NOTE — Progress Notes (Signed)
Office Note 06/25/2016  CC:  Chief Complaint  Patient presents with  . Establish Care  . Follow-up    cholesterol, pt is not fasting.   . Bruising    HPI:  Stephen Bryant is a 66 y.o. male who is here to establish care, discuss hyperlipidemia as well as bruising. Patient's most recent primary MD: Dr. Linna Darner (retired). Old records in EPIC/[facility] EMR were reviewed prior to or during today's visit.  Hx of hyperlipidemia.  Wants to get recheck of labs b/c he notes he has gradually gained wt. Some easy bruising on both forearms, he works in a way that these areas get hit lightly frequently. No bruising anywhere else.  He has never had prostate cancer screening and he is agreeable to getting DRE today and adding PSA on to blood work to be done soon when fasting.  BP up here today x 2 measurements: has no hx of elevated bp, does no home bp monitoring.  Past Medical History:  Diagnosis Date  . AVM (arteriovenous malformation) of colon   . Colitis    sigmoid colon   . Diverticulosis of colon (without mention of hemorrhage)   . Esophageal stricture   . GERD (gastroesophageal reflux disease)   . Hiatal hernia   . History of hyperlipidemia    statin x 1 yr: tolerated med but was taken off it after 1 yr and lipids have been ok as of 2015.  Marland Kitchen Personal history of colonic polyps 04/16/2010   tubulovillous adenomas    Past Surgical History:  Procedure Laterality Date  . colonoscopy  with polypectomy  2012; 10/2013   Tubular adenoma: recall 11/2018.  Marland Kitchen ESOPHAGOGASTRODUODENOSCOPY (EGD) WITH ESOPHAGEAL DILATION  05/2014  . VASECTOMY      Family History  Problem Relation Age of Onset  . Stomach cancer Mother   . COPD Brother   . Heart attack Father 62  . Diabetes Father   . Diabetes Maternal Grandmother   . Heart attack Sister 7  . Colon cancer Neg Hx   . Stroke Neg Hx   . Asthma Neg Hx   . Esophageal cancer Neg Hx   . Rectal cancer Neg Hx     Social History   Social History   . Marital status: Married    Spouse name: N/A  . Number of children: 2  . Years of education: N/A   Occupational History  . service [employer] History Main Topics  . Smoking status: Former Smoker    Packs/day: 1.00    Years: 38.00    Types: Cigarettes    Quit date: 03/03/2003  . Smokeless tobacco: Never Used     Comment: smoked age [age], up to 1 ppd  . Alcohol use 3.5 - 7.0 oz/week    7 - 14 Standard drinks or equivalent per week     Comment: 2 glasses of wine everyday or every other day  . Drug use: No  . Sexual activity: Not on file   Other Topics Concern  . Not on file   Social History Narrative   Married, two daughters married.   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   Alc: 1-2 glasses of wine most days.   Tob: quit 2005.  40+  pack-yr hx.       Outpatient Encounter Prescriptions as of 06/25/2016  Medication Sig  . ibuprofen (ADVIL,MOTRIN) 200 MG tablet Take 200 mg by mouth every 6 (six) hours as  needed.  Marland Kitchen omeprazole (PRILOSEC) 40 MG capsule Take 1 capsule (40 mg total) by mouth 2 (two) times daily.   No facility-administered encounter medications on file as of 06/25/2016.     No Known Allergies  ROS Review of Systems  Constitutional: Negative for fatigue and fever.  HENT: Negative for congestion and sore throat.   Eyes: Negative for visual disturbance.  Respiratory: Negative for cough.   Cardiovascular: Negative for chest pain.  Gastrointestinal: Negative for abdominal pain and nausea.  Genitourinary: Negative for dysuria.  Musculoskeletal: Negative for back pain and joint swelling.  Skin: Negative for rash.  Neurological: Negative for weakness and headaches.  Hematological: Negative for adenopathy.    PE; Blood pressure (!) 160/108, pulse 61, temperature 97.8 F (36.6 C), temperature source Oral, resp. rate 16, height 5' 8"  (1.727 m), weight 191 lb (86.6 kg), SpO2 99 %.Repeat bp today: 160/108 Gen: Alert, well  appearing.  Patient is oriented to person, place, time, and situation. AFFECT: pleasant, lucid thought and speech. ZLD:JTTS: no injection, icteris, swelling, or exudate.  EOMI, PERRLA. Mouth: lips without lesion/swelling.  Oral mucosa pink and moist. Oropharynx without erythema, exudate, or swelling.  CV: RRR, no m/r/g.   LUNGS: CTA bilat, nonlabored resps, good aeration in all lung fields. EXT: no clubbing, cyanosis, or edema.  Rectal exam: negative without mass, lesions or tenderness, PROSTATE EXAM: smooth and symmetric without nodules or tenderness.  Pertinent labs:  Lab Results  Component Value Date   TSH 1.37 07/20/2013   Lab Results  Component Value Date   WBC 6.7 06/27/2014   HGB 14.9 06/27/2014   HCT 43.6 06/27/2014   MCV 88.1 06/27/2014   PLT 326.0 06/27/2014   Lab Results  Component Value Date   CREATININE 0.9 07/20/2013   BUN 10 07/20/2013   NA 141 07/20/2013   K 5.0 07/20/2013   CL 106 07/20/2013   CO2 30 07/20/2013   Lab Results  Component Value Date   ALT 27 07/20/2013   AST 31 07/20/2013   ALKPHOS 54 07/20/2013   BILITOT 0.9 07/20/2013   Lab Results  Component Value Date   CHOL 189 07/20/2013   Lab Results  Component Value Date   HDL 73.20 07/20/2013   Lab Results  Component Value Date   LDLCALC 104 (H) 07/20/2013   Lab Results  Component Value Date   TRIG 59.0 07/20/2013   Lab Results  Component Value Date   CHOLHDL 3 07/20/2013   Lab Results  Component Value Date   HGBA1C 5.8 07/20/2013   ASSESSMENT AND PLAN:   New/transfer pt: no old records needed.  1) Hx of hyperlipidemia. Return for fasting labs.  2) Bruising on forearms: reassured pt this is normal for age and circumstances.  3) Elev BP w/out dx of HTN:  Instructions: Buy blood pressure cuff (upper arm cuff, automated) and check bp morning and evening for the next 2 wks. Bring these numbers in with you to your next f/u with me in 2 weeks. Gave pt handout about how to  check bp at home.  4) Preventative health care: prevnar 13 given today.  Fasting HP labs ordered--future.  An After Visit Summary was printed and given to the patient.  Return in about 2 weeks (around 07/09/2016) for f/u BP.  Also, pt needs lab appt at his earliest convenience (fasting)..  Signed:  Crissie Sickles, MD           06/25/2016

## 2016-07-01 ENCOUNTER — Other Ambulatory Visit (INDEPENDENT_AMBULATORY_CARE_PROVIDER_SITE_OTHER): Payer: BLUE CROSS/BLUE SHIELD

## 2016-07-01 DIAGNOSIS — Z125 Encounter for screening for malignant neoplasm of prostate: Secondary | ICD-10-CM | POA: Diagnosis not present

## 2016-07-01 DIAGNOSIS — Z8639 Personal history of other endocrine, nutritional and metabolic disease: Secondary | ICD-10-CM | POA: Diagnosis not present

## 2016-07-01 DIAGNOSIS — Z0000 Encounter for general adult medical examination without abnormal findings: Secondary | ICD-10-CM

## 2016-07-01 LAB — COMPREHENSIVE METABOLIC PANEL
ALT: 16 U/L (ref 0–53)
AST: 19 U/L (ref 0–37)
Albumin: 4.2 g/dL (ref 3.5–5.2)
Alkaline Phosphatase: 63 U/L (ref 39–117)
BUN: 15 mg/dL (ref 6–23)
CHLORIDE: 105 meq/L (ref 96–112)
CO2: 29 meq/L (ref 19–32)
CREATININE: 0.94 mg/dL (ref 0.40–1.50)
Calcium: 9.3 mg/dL (ref 8.4–10.5)
GFR: 85.09 mL/min (ref >60.00)
GLUCOSE: 105 mg/dL — AB (ref 70–99)
Potassium: 4.9 mEq/L (ref 3.5–5.1)
SODIUM: 140 meq/L (ref 135–145)
Total Bilirubin: 0.9 mg/dL (ref 0.2–1.2)
Total Protein: 6.9 g/dL (ref 6.0–8.3)

## 2016-07-01 LAB — CBC WITH DIFFERENTIAL/PLATELET
Basophils Absolute: 0 10*3/uL (ref 0.0–0.1)
Basophils Relative: 0.6 % (ref 0.0–3.0)
Eosinophils Absolute: 0.3 10*3/uL (ref 0.0–0.7)
Eosinophils Relative: 4.2 % (ref 0.0–5.0)
HCT: 44.4 % (ref 39.0–52.0)
Hemoglobin: 14.6 g/dL (ref 13.0–17.0)
Lymphocytes Relative: 42.1 % (ref 12.0–46.0)
Lymphs Abs: 2.8 10*3/uL (ref 0.7–4.0)
MCHC: 32.9 g/dL (ref 30.0–36.0)
MCV: 91 fl (ref 78.0–100.0)
Monocytes Absolute: 0.9 10*3/uL (ref 0.1–1.0)
Monocytes Relative: 13.2 % — ABNORMAL HIGH (ref 3.0–12.0)
Neutro Abs: 2.6 10*3/uL (ref 1.4–7.7)
Neutrophils Relative %: 39.9 % — ABNORMAL LOW (ref 43.0–77.0)
Platelets: 328 10*3/uL (ref 150.0–400.0)
RBC: 4.88 Mil/uL (ref 4.22–5.81)
RDW: 14.7 % (ref 11.5–15.5)
WBC: 6.6 10*3/uL (ref 4.0–10.5)

## 2016-07-01 LAB — LIPID PANEL
CHOL/HDL RATIO: 4
CHOLESTEROL: 259 mg/dL — AB (ref 0–200)
HDL: 73.9 mg/dL (ref >39.00)
LDL CALC: 164 mg/dL — AB (ref 0–99)
NonHDL: 184.96
Triglycerides: 107 mg/dL (ref 0.0–149.0)
VLDL: 21.4 mg/dL (ref 0.0–40.0)

## 2016-07-01 LAB — TSH: TSH: 2.18 u[IU]/mL (ref 0.35–4.50)

## 2016-07-01 LAB — PSA: PSA: 1.02 ng/mL (ref 0.10–4.00)

## 2016-07-02 ENCOUNTER — Encounter: Payer: Self-pay | Admitting: Family Medicine

## 2016-07-02 ENCOUNTER — Other Ambulatory Visit (INDEPENDENT_AMBULATORY_CARE_PROVIDER_SITE_OTHER): Payer: BLUE CROSS/BLUE SHIELD

## 2016-07-02 DIAGNOSIS — E785 Hyperlipidemia, unspecified: Secondary | ICD-10-CM | POA: Diagnosis not present

## 2016-07-02 LAB — HEMOGLOBIN A1C: HEMOGLOBIN A1C: 5.9 % (ref 4.6–6.5)

## 2016-07-03 ENCOUNTER — Other Ambulatory Visit: Payer: Self-pay | Admitting: *Deleted

## 2016-07-03 DIAGNOSIS — E7800 Pure hypercholesterolemia, unspecified: Secondary | ICD-10-CM

## 2016-07-03 MED ORDER — ATORVASTATIN CALCIUM 20 MG PO TABS: 20.0000 mg | ORAL_TABLET | Freq: Every day | ORAL | 2 refills | Status: DC

## 2016-07-09 ENCOUNTER — Ambulatory Visit (INDEPENDENT_AMBULATORY_CARE_PROVIDER_SITE_OTHER): Payer: BLUE CROSS/BLUE SHIELD | Admitting: Family Medicine

## 2016-07-09 ENCOUNTER — Encounter: Payer: Self-pay | Admitting: Family Medicine

## 2016-07-09 VITALS — BP 124/73 | HR 74 | Temp 98.4°F | Resp 16 | Ht 68.0 in | Wt 187.2 lb

## 2016-07-09 DIAGNOSIS — E7800 Pure hypercholesterolemia, unspecified: Secondary | ICD-10-CM

## 2016-07-09 DIAGNOSIS — I10 Essential (primary) hypertension: Secondary | ICD-10-CM

## 2016-07-09 MED ORDER — HYDROCHLOROTHIAZIDE 12.5 MG PO CAPS
12.5000 mg | ORAL_CAPSULE | Freq: Every day | ORAL | 0 refills | Status: DC
Start: 2016-07-09 — End: 2016-08-07

## 2016-07-09 NOTE — Progress Notes (Signed)
OFFICE VISIT  07/09/2016   CC:  Chief Complaint  Patient presents with  . Follow-up    elevated BP, pt is not fasting.    HPI:    Patient is a 66 y.o.  male who presents for 2 week f/u elevated blood pressure. Last visit his bp was elevated into stage 2 range, no hx of similar elevation or dx of HTN. Our plan was to have him monitor daily bp at home and return with these numbers today to review with me.  HTN: home avg systolic 734, avg diastolic 19+, HR 37T-02I avg.  Denies HA, dizziness, vision complaints, CP, SOB, or focal weakness.  No urinary frequency, urgency, or poor urine stream. Gets up 1 time at night to urinate, occ 2 times.  Started atorvastatin a few days ago for his hypercholesterolemia.  Says he is not having any side effects at this time.  Past Medical History:  Diagnosis Date  . AVM (arteriovenous malformation) of colon   . Colitis    sigmoid colon   . Diverticulosis of colon (without mention of hemorrhage)   . Esophageal stricture   . GERD (gastroesophageal reflux disease)   . Hiatal hernia   . History of hyperlipidemia    statin x 1 yr: tolerated med but was taken off it after 1 yr and lipids have been ok as of 2015.  . IFG (impaired fasting glucose)    Mild; HbA1c's consistently 5.9% or less.  . Personal history of colonic polyps 04/16/2010   tubulovillous adenomas    Past Surgical History:  Procedure Laterality Date  . colonoscopy  with polypectomy  2012; 10/2013   Tubular adenoma: recall 11/2018.  Marland Kitchen ESOPHAGOGASTRODUODENOSCOPY (EGD) WITH ESOPHAGEAL DILATION  05/2014  . VASECTOMY      Outpatient Medications Prior to Visit  Medication Sig Dispense Refill  . atorvastatin (LIPITOR) 20 MG tablet Take 1 tablet (20 mg total) by mouth daily. 30 tablet 2  . ibuprofen (ADVIL,MOTRIN) 200 MG tablet Take 200 mg by mouth every 6 (six) hours as needed.    Marland Kitchen omeprazole (PRILOSEC) 40 MG capsule Take 1 capsule (40 mg total) by mouth daily. 30 capsule 12   No  facility-administered medications prior to visit.     No Known Allergies  ROS As per HPI  PE: Blood pressure 124/73, pulse 74, temperature 98.4 F (36.9 C), temperature source Oral, resp. rate 16, height 5' 8"  (1.727 m), weight 187 lb 4 oz (84.9 kg), SpO2 96 %. Gen: Alert, well appearing.  Patient is oriented to person, place, time, and situation. AFFECT: pleasant, lucid thought and speech. No further exam today.  LABS:    Chemistry      Component Value Date/Time   NA 140 07/01/2016 0757   K 4.9 07/01/2016 0757   CL 105 07/01/2016 0757   CO2 29 07/01/2016 0757   BUN 15 07/01/2016 0757   CREATININE 0.94 07/01/2016 0757      Component Value Date/Time   CALCIUM 9.3 07/01/2016 0757   ALKPHOS 63 07/01/2016 0757   AST 19 07/01/2016 0757   ALT 16 07/01/2016 0757   BILITOT 0.9 07/01/2016 0757     Lab Results  Component Value Date   HGBA1C 5.9 07/02/2016   IMPRESSION AND PLAN:  1) HTN, new dx--stage II (diastolics) by new guidelines. Start hctz 12.31m qd, continue home bp monitoring and HR monitoriing. Therapeutic expectations and side effect profile of medication discussed today.  Patient's questions answered. Plan on checking BMET at f/u in  1 mo.  2) Hypercholesterolemia: tolerating atorvastatin started very recently.  He is set up for repeat FLP as appropriate.  FOLLOW UP: Return in about 4 weeks (around 08/06/2016) for f/u HTN/check BMET.  Signed:  Crissie Sickles, MD           07/09/2016

## 2016-08-07 ENCOUNTER — Ambulatory Visit (INDEPENDENT_AMBULATORY_CARE_PROVIDER_SITE_OTHER): Payer: BLUE CROSS/BLUE SHIELD | Admitting: Family Medicine

## 2016-08-07 ENCOUNTER — Encounter: Payer: Self-pay | Admitting: Family Medicine

## 2016-08-07 VITALS — BP 132/76 | HR 104 | Temp 98.8°F | Resp 16 | Ht 68.0 in | Wt 181.8 lb

## 2016-08-07 DIAGNOSIS — E7800 Pure hypercholesterolemia, unspecified: Secondary | ICD-10-CM

## 2016-08-07 DIAGNOSIS — D750 Familial erythrocytosis: Secondary | ICD-10-CM | POA: Diagnosis not present

## 2016-08-07 MED ORDER — HYDROCHLOROTHIAZIDE 12.5 MG PO CAPS: 12.5000 mg | ORAL_CAPSULE | Freq: Every day | ORAL | 3 refills | Status: DC

## 2016-08-07 NOTE — Progress Notes (Signed)
OFFICE VISIT  08/07/2016   CC:  Chief Complaint  Patient presents with  . Follow-up    HTN   HPI:    Patient is a 66 y.o. Caucasian male who presents for 4 week f/u HTN and needs BMET recheck. Last visit I started hctz 12.81m qd for new dx HTN. No side effects from med. HOme monitoring shows avg 110/70, HR avg 70.  He started taking atorva 260mqd about 4-5 weeks ago and is tolerating this well. He asks when he should come back for lipid recheck.  No urinary frequency, no CP, no SOB, no palpitations, no HAs.  No rash.  Past Medical History:  Diagnosis Date  . AVM (arteriovenous malformation) of colon   . Colitis    sigmoid colon   . Diverticulosis of colon (without mention of hemorrhage)   . Esophageal stricture   . GERD (gastroesophageal reflux disease)   . Hiatal hernia   . History of hyperlipidemia    statin x 1 yr: tolerated med but was taken off it after 1 yr and lipids have been ok as of 2015.  . IFG (impaired fasting glucose)    Mild; HbA1c's consistently 5.9% or less.  . Personal history of colonic polyps 04/16/2010   tubulovillous adenomas    Past Surgical History:  Procedure Laterality Date  . colonoscopy  with polypectomy  2012; 10/2013   Tubular adenoma: recall 11/2018.  . Marland KitchenSOPHAGOGASTRODUODENOSCOPY (EGD) WITH ESOPHAGEAL DILATION  05/2014  . VASECTOMY      Outpatient Medications Prior to Visit  Medication Sig Dispense Refill  . atorvastatin (LIPITOR) 20 MG tablet Take 1 tablet (20 mg total) by mouth daily. 30 tablet 2  . ibuprofen (ADVIL,MOTRIN) 200 MG tablet Take 200 mg by mouth every 6 (six) hours as needed.    . Marland Kitchenmeprazole (PRILOSEC) 40 MG capsule Take 1 capsule (40 mg total) by mouth daily. 30 capsule 12  . hydrochlorothiazide (MICROZIDE) 12.5 MG capsule Take 1 capsule (12.5 mg total) by mouth daily. 30 capsule 0   No facility-administered medications prior to visit.     No Known Allergies  ROS As per HPI  PE: Blood pressure 132/76, pulse (!)  104, temperature 98.8 F (37.1 C), temperature source Oral, resp. rate 16, height 5' 8"  (1.727 m), weight 181 lb 12 oz (82.4 kg), SpO2 97 %. Gen: Alert, well appearing.  Patient is oriented to person, place, time, and situation. AFFECT: pleasant, lucid thought and speech. CV: RRR, no m/r/g.   LUNGS: CTA bilat, nonlabored resps, good aeration in all lung fields. EXT: no clubbing, cyanosis, or edema.    LABS:    Chemistry      Component Value Date/Time   NA 140 07/01/2016 0757   K 4.9 07/01/2016 0757   CL 105 07/01/2016 0757   CO2 29 07/01/2016 0757   BUN 15 07/01/2016 0757   CREATININE 0.94 07/01/2016 0757      Component Value Date/Time   CALCIUM 9.3 07/01/2016 0757   ALKPHOS 63 07/01/2016 0757   AST 19 07/01/2016 0757   ALT 16 07/01/2016 0757   BILITOT 0.9 07/01/2016 0757     Lab Results  Component Value Date   HGBA1C 5.9 07/02/2016   IMPRESSION AND PLAN:  1) HTN: very well controlled on low dose hctz. Continue this, continue periodic home bp monitoring. Check lytes/cr today.  2) Hypercholesterolemia: started statin a little over a month ago. Tolerating med well. He'll return for fasting lipid panel in 1-2 mo--lab visit.  An After Visit Summary was printed and given to the patient.  FOLLOW UP: Return in about 6 months (around 02/06/2017) for annual CPE (fasting)--also, lab appt for fasting lipid panel in 1-2 months.  Signed:  Crissie Sickles, MD           08/07/2016

## 2016-08-08 LAB — BASIC METABOLIC PANEL
BUN: 17 mg/dL (ref 7–25)
CALCIUM: 9.3 mg/dL (ref 8.6–10.3)
CO2: 23 mmol/L (ref 20–31)
Chloride: 101 mmol/L (ref 98–110)
Creat: 1.01 mg/dL (ref 0.70–1.25)
GLUCOSE: 111 mg/dL — AB (ref 65–99)
Potassium: 4.1 mmol/L (ref 3.5–5.3)
Sodium: 140 mmol/L (ref 135–146)

## 2016-09-22 ENCOUNTER — Other Ambulatory Visit: Payer: BLUE CROSS/BLUE SHIELD

## 2016-09-22 ENCOUNTER — Ambulatory Visit (INDEPENDENT_AMBULATORY_CARE_PROVIDER_SITE_OTHER): Payer: BLUE CROSS/BLUE SHIELD | Admitting: Family Medicine

## 2016-09-22 ENCOUNTER — Encounter: Payer: Self-pay | Admitting: Family Medicine

## 2016-09-22 VITALS — BP 129/76 | HR 74 | Temp 98.2°F | Resp 16 | Ht 68.0 in | Wt 182.0 lb

## 2016-09-22 DIAGNOSIS — Z1159 Encounter for screening for other viral diseases: Secondary | ICD-10-CM | POA: Diagnosis not present

## 2016-09-22 DIAGNOSIS — I10 Essential (primary) hypertension: Secondary | ICD-10-CM | POA: Diagnosis not present

## 2016-09-22 DIAGNOSIS — E7800 Pure hypercholesterolemia, unspecified: Secondary | ICD-10-CM

## 2016-09-22 DIAGNOSIS — R200 Anesthesia of skin: Secondary | ICD-10-CM | POA: Diagnosis not present

## 2016-09-22 DIAGNOSIS — R202 Paresthesia of skin: Secondary | ICD-10-CM

## 2016-09-22 DIAGNOSIS — Z9189 Other specified personal risk factors, not elsewhere classified: Secondary | ICD-10-CM

## 2016-09-22 DIAGNOSIS — T887XXA Unspecified adverse effect of drug or medicament, initial encounter: Secondary | ICD-10-CM

## 2016-09-22 LAB — LIPID PANEL
CHOL/HDL RATIO: 3
Cholesterol: 174 mg/dL (ref 0–200)
HDL: 68.8 mg/dL (ref >39.00)
LDL CALC: 82 mg/dL (ref 0–99)
NONHDL: 104.85
Triglycerides: 114 mg/dL (ref 0.0–149.0)
VLDL: 22.8 mg/dL (ref 0.0–40.0)

## 2016-09-22 LAB — HEPATITIS C ANTIBODY: HCV AB: NEGATIVE

## 2016-09-22 MED ORDER — IRBESARTAN 75 MG PO TABS: 75.0000 mg | ORAL_TABLET | Freq: Every day | ORAL | 6 refills | Status: DC

## 2016-09-22 NOTE — Progress Notes (Signed)
OFFICE VISIT  09/22/2016   CC:  Chief Complaint  Patient presents with  . Follow-up    HCL and HTN   HPI:    Patient is a 66 y.o. Caucasian male who presents for his concern of side effects from the hctz that he started a couple months ago for his HTN.   Describes feeling a numbness tingling in L leg on back of thigh down to knee level--onset 4 d/a.  Constant worse at the end of the day. The sensation was not as pronounced when sitting down/resting but it did not resolve.  No pain.  No weakness.  No recent change in activity/exercise.  He has never had anything like this before. He looked at the side effect profile of hctz and numbness and tingling was listed.  Also has recently had some pain in lateral epicondyle region last couple weeks, resolved with use of tennis elbow strap.  He stopped his hctz yesterday and his leg symptoms subsided yesterday and are gone today. BPs have been well controlled on the hctz.  BP normal here today.  ROS: No focal weakness, no dysarthria or dysphagia.  No vision or hearing abnormalities. No back pain.  No leg pain.   Also, due for repeat FLP after starting statin 06/2016. Also, he desires Hep C screening--he is in the "baby boomer" generation.  Past Medical History:  Diagnosis Date  . AVM (arteriovenous malformation) of colon   . Colitis    sigmoid colon   . Diverticulosis of colon (without mention of hemorrhage)   . Esophageal stricture   . Essential hypertension 2018  . GERD (gastroesophageal reflux disease)   . Hiatal hernia   . Hypercholesterolemia    statin x 1 yr: tolerated med but was taken off it after 1 yr and lipids have been ok as of 2015.  Restarted atorva 06/2016.  . IFG (impaired fasting glucose)    Mild; HbA1c's consistently 5.9% or less.  . Personal history of colonic polyps 04/16/2010   tubulovillous adenomas    Past Surgical History:  Procedure Laterality Date  . colonoscopy  with polypectomy  2012; 10/2013   Tubular  adenoma: recall 11/2018.  Marland Kitchen ESOPHAGOGASTRODUODENOSCOPY (EGD) WITH ESOPHAGEAL DILATION  05/2014  . VASECTOMY      Outpatient Medications Prior to Visit  Medication Sig Dispense Refill  . atorvastatin (LIPITOR) 20 MG tablet Take 1 tablet (20 mg total) by mouth daily. 30 tablet 2  . ibuprofen (ADVIL,MOTRIN) 200 MG tablet Take 200 mg by mouth every 6 (six) hours as needed.    Marland Kitchen omeprazole (PRILOSEC) 40 MG capsule Take 1 capsule (40 mg total) by mouth daily. 30 capsule 12  . hydrochlorothiazide (MICROZIDE) 12.5 MG capsule Take 1 capsule (12.5 mg total) by mouth daily. (Patient not taking: Reported on 09/22/2016) 90 capsule 3   No facility-administered medications prior to visit.     No Known Allergies  ROS As per HPI  PE: Blood pressure 129/76, pulse 74, temperature 98.2 F (36.8 C), temperature source Oral, resp. rate 16, height 5' 8"  (1.727 m), weight 182 lb (82.6 kg), SpO2 98 %. Gen: Alert, well appearing.  Patient is oriented to person, place, time, and situation. AFFECT: pleasant, lucid thought and speech. CV: RRR, no m/r/g.   LUNGS: CTA bilat, nonlabored resps, good aeration in all lung fields. Neuro: CN 2-12 intact bilaterally, strength 5/5 in proximal and distal upper extremities and lower extremities bilaterally.  No sensory deficits.  No tremor.  No disdiadochokinesis.  No ataxia.  Upper extremity and lower extremity DTRs symmetric.  No pronator drift.   LABS:  Lab Results  Component Value Date   TSH 2.18 07/01/2016   Lab Results  Component Value Date   WBC 6.6 07/01/2016   HGB 14.6 07/01/2016   HCT 44.4 07/01/2016   MCV 91.0 07/01/2016   PLT 328.0 07/01/2016   Lab Results  Component Value Date   CREATININE 1.01 08/07/2016   BUN 17 08/07/2016   NA 140 08/07/2016   K 4.1 08/07/2016   CL 101 08/07/2016   CO2 23 08/07/2016   Lab Results  Component Value Date   ALT 16 07/01/2016   AST 19 07/01/2016   ALKPHOS 63 07/01/2016   BILITOT 0.9 07/01/2016   Lab  Results  Component Value Date   CHOL 259 (H) 07/01/2016   Lab Results  Component Value Date   HDL 73.90 07/01/2016   Lab Results  Component Value Date   LDLCALC 164 (H) 07/01/2016   Lab Results  Component Value Date   TRIG 107.0 07/01/2016   Lab Results  Component Value Date   CHOLHDL 4 07/01/2016   Lab Results  Component Value Date   PSA 1.02 07/01/2016   Lab Results  Component Value Date   HGBA1C 5.9 07/02/2016   IMPRESSION AND PLAN:  1) Med side effect (possible): stop hctz.  Wait a full week to make sure leg tingling does not recur. Then start irbesartan 56m qd IF bp is staying above 120/80. Also, start ASA 849mqd. I don't think this was TIA.  ? Possible lumbar nerve compression sx's (but no hx of back paiin). He'll call if this symptom in his leg recurs and lasts >24 hours.  2) HLD: Due for repeat FLP after starting statin 06/2016--done today.  3) HTN: well controlled on hctz.  See #1 above.  4) Preventative health care: hep c screening done today.  An After Visit Summary was printed and given to the patient.  FOLLOW UP: Return for keep f/u appt already scheduled for 01/2017..  Signed:  PhCrissie SicklesMD           09/22/2016

## 2016-09-22 NOTE — Patient Instructions (Signed)
Wait 1 week to start your new blood pressure medication.  Call if your leg tingling/numbness returns and persists > 24h.

## 2016-09-23 ENCOUNTER — Encounter: Payer: Self-pay | Admitting: Family Medicine

## 2016-09-23 ENCOUNTER — Encounter: Payer: Self-pay | Admitting: *Deleted

## 2016-09-29 ENCOUNTER — Other Ambulatory Visit: Payer: Self-pay | Admitting: Family Medicine

## 2016-09-29 NOTE — Telephone Encounter (Signed)
[facility].  RF request for atorvastatin LOV: 09/22/16 Next ov: 02/09/17 Last written: 07/03/16 #30 w/ 2RF

## 2017-01-25 ENCOUNTER — Encounter: Payer: Self-pay | Admitting: Family Medicine

## 2017-01-25 ENCOUNTER — Other Ambulatory Visit: Payer: Self-pay

## 2017-01-25 ENCOUNTER — Ambulatory Visit (INDEPENDENT_AMBULATORY_CARE_PROVIDER_SITE_OTHER): Payer: BLUE CROSS/BLUE SHIELD | Admitting: Family Medicine

## 2017-01-25 VITALS — BP 136/79 | HR 85 | Temp 98.5°F | Resp 16 | Ht 68.0 in | Wt 192.5 lb

## 2017-01-25 DIAGNOSIS — L309 Dermatitis, unspecified: Secondary | ICD-10-CM | POA: Diagnosis not present

## 2017-01-25 MED ORDER — FLUTICASONE PROPIONATE 0.05 % EX CREA: TOPICAL_CREAM | CUTANEOUS | 1 refills | Status: DC

## 2017-01-25 NOTE — Progress Notes (Signed)
OFFICE VISIT  01/25/2017   CC:  Chief Complaint  Patient presents with  . Rash   HPI:    Patient is a 66 y.o.  male who presents for rash. Onset of itching on R arm 2 days ago, saw a little pink splotch, itched it more, similar lesions started to pop up near the initial lesion, extending down arm some.  No other areas of the body have any rash or itching.  No blisters or pustules.  NO pain anywhere. No known recent contact irritants or allergens. No tongue or lips swelling, no throat swelling, no SOB, wheezing, dizziness, or abd sx's.  Past Medical History:  Diagnosis Date  . AVM (arteriovenous malformation) of colon   . Colitis    sigmoid colon   . Diverticulosis of colon (without mention of hemorrhage)   . Esophageal stricture   . Essential hypertension 2018  . GERD (gastroesophageal reflux disease)   . Hiatal hernia   . Hypercholesterolemia    statin x 1 yr: tolerated med but was taken off it after 1 yr and lipids have been ok as of 2015.  Restarted atorva 06/2016.  . IFG (impaired fasting glucose)    Mild; HbA1c's consistently 5.9% or less.  . Personal history of colonic polyps 04/16/2010   tubulovillous adenomas    Past Surgical History:  Procedure Laterality Date  . colonoscopy  with polypectomy  2012; 10/2013   Tubular adenoma: recall 11/2018.  Marland Kitchen ESOPHAGOGASTRODUODENOSCOPY (EGD) WITH ESOPHAGEAL DILATION  05/2014  . VASECTOMY      Outpatient Medications Prior to Visit  Medication Sig Dispense Refill  . atorvastatin (LIPITOR) 20 MG tablet TAKE 1 TABLET BY MOUTH EVERY DAY 30 tablet 5  . ibuprofen (ADVIL,MOTRIN) 200 MG tablet Take 200 mg by mouth every 6 (six) hours as needed.    . irbesartan (AVAPRO) 75 MG tablet Take 1 tablet (75 mg total) by mouth daily. 30 tablet 6  . omeprazole (PRILOSEC) 40 MG capsule Take 1 capsule (40 mg total) by mouth daily. 30 capsule 12   No facility-administered medications prior to visit.     No Known Allergies  ROS As per  HPI  PE: Blood pressure 136/79, pulse 85, temperature 98.5 F (36.9 C), temperature source Oral, resp. rate 16, height 5' 8"  (1.727 m), weight 192 lb 8 oz (87.3 kg), SpO2 98 %. Gen: Alert, well appearing.  Patient is oriented to person, place, time, and situation. AFFECT: pleasant, lucid thought and speech. Inner aspect of R arm with scattered pink plaques/papules with some superficial peeling over the lesions.  No blanching of the lesions.  No vesicles, pustules, or petechiae.  No tenderness or streaking.    LABS:    Chemistry      Component Value Date/Time   NA 140 08/07/2016 1546   K 4.1 08/07/2016 1546   CL 101 08/07/2016 1546   CO2 23 08/07/2016 1546   BUN 17 08/07/2016 1546   CREATININE 1.01 08/07/2016 1546      Component Value Date/Time   CALCIUM 9.3 08/07/2016 1546   ALKPHOS 63 07/01/2016 0757   AST 19 07/01/2016 0757   ALT 16 07/01/2016 0757   BILITOT 0.9 07/01/2016 0757     Lab Results  Component Value Date   CHOL 174 09/22/2016   HDL 68.80 09/22/2016   LDLCALC 82 09/22/2016   LDLDIRECT 201.5 01/27/2012   TRIG 114.0 09/22/2016   CHOLHDL 3 09/22/2016   Lab Results  Component Value Date   HGBA1C 5.9 07/02/2016  IMPRESSION AND PLAN:  Dermatitis of R arm, looks like contact dermatitis---unknown allergen/irritant. Discussed use of oral antihistamines OTC prn, calamine lotion prn, and I rx'd cutivate 0.05% cream to apply to affected area bid.  An After Visit Summary was printed and given to the patient.  FOLLOW UP: Return if symptoms worsen or fail to improve.  Signed:  Crissie Sickles, MD           01/25/2017

## 2017-02-09 ENCOUNTER — Encounter: Payer: BLUE CROSS/BLUE SHIELD | Admitting: Family Medicine

## 2017-03-18 ENCOUNTER — Ambulatory Visit (INDEPENDENT_AMBULATORY_CARE_PROVIDER_SITE_OTHER): Payer: Medicare Other | Admitting: Family Medicine

## 2017-03-18 ENCOUNTER — Encounter: Payer: Self-pay | Admitting: Family Medicine

## 2017-03-18 VITALS — BP 124/71 | HR 79 | Temp 98.1°F | Ht 68.0 in | Wt 186.8 lb

## 2017-03-18 DIAGNOSIS — J209 Acute bronchitis, unspecified: Secondary | ICD-10-CM

## 2017-03-18 DIAGNOSIS — J069 Acute upper respiratory infection, unspecified: Secondary | ICD-10-CM

## 2017-03-18 DIAGNOSIS — B9789 Other viral agents as the cause of diseases classified elsewhere: Secondary | ICD-10-CM | POA: Diagnosis not present

## 2017-03-18 MED ORDER — PREDNISONE 20 MG PO TABS
ORAL_TABLET | ORAL | 0 refills | Status: DC
Start: 2017-03-18 — End: 2017-05-04

## 2017-03-18 NOTE — Patient Instructions (Signed)
Get otc generic robitussin DM OR Mucinex DM and use as directed on the packaging for cough and congestion. Use otc generic saline nasal spray 2-3 times per day to irrigate/moisturize your nasal passages.   

## 2017-03-18 NOTE — Progress Notes (Signed)
OFFICE VISIT  03/18/2017   CC:  Chief Complaint  Patient presents with  . URI    pts complain of congestion, dry cough, and warm sensation in his chest but denies fever and sore throat that started a week, try mucinex    HPI:    Patient is a 67 y.o. Caucasian male who presents for "chest congestion". Onset 2 weeks ago nasal congestion/runny nose, PND, not much cough at first. Dayquil and mucinex used.  Then these sxs began clearing up and a dry cough started occurring more. Gets worse throughout the day---feels chest warmth in upper chest with each cough. No fever.  No SOB.  Describes a bit of chest wheeze but this clears with coughing.  NO body aches or fatigue.   Has 40 pack-yr smoking hx, quit 2005.  No hx of recurrent bronchitis or asthma.  Past Medical History:  Diagnosis Date  . AVM (arteriovenous malformation) of colon   . Colitis    sigmoid colon   . Diverticulosis of colon (without mention of hemorrhage)   . Esophageal stricture   . Essential hypertension 2018  . GERD (gastroesophageal reflux disease)   . Hiatal hernia   . Hypercholesterolemia    statin x 1 yr: tolerated med but was taken off it after 1 yr and lipids have been ok as of 2015.  Restarted atorva 06/2016.  . IFG (impaired fasting glucose)    Mild; HbA1c's consistently 5.9% or less.  . Personal history of colonic polyps 04/16/2010   tubulovillous adenomas    Past Surgical History:  Procedure Laterality Date  . colonoscopy  with polypectomy  2012; 10/2013   Tubular adenoma: recall 11/2018.  Marland Kitchen ESOPHAGOGASTRODUODENOSCOPY (EGD) WITH ESOPHAGEAL DILATION  05/2014  . VASECTOMY      Outpatient Medications Prior to Visit  Medication Sig Dispense Refill  . atorvastatin (LIPITOR) 20 MG tablet TAKE 1 TABLET BY MOUTH EVERY DAY 30 tablet 5  . irbesartan (AVAPRO) 75 MG tablet Take 1 tablet (75 mg total) by mouth daily. 30 tablet 6  . omeprazole (PRILOSEC) 40 MG capsule Take 1 capsule (40 mg total) by mouth daily.  30 capsule 12  . ibuprofen (ADVIL,MOTRIN) 200 MG tablet Take 200 mg by mouth every 6 (six) hours as needed.    . fluticasone (CUTIVATE) 0.05 % cream Apply to affected area twice per day for itchy and inflamed skin (Patient not taking: Reported on 03/18/2017) 30 g 1   No facility-administered medications prior to visit.     No Known Allergies  ROS As per HPI  PE: Blood pressure 124/71, pulse 79, temperature 98.1 F (36.7 C), temperature source Oral, height 5' 8"  (1.727 m), weight 186 lb 12.8 oz (84.7 kg), SpO2 100 %. VS: noted--normal. Gen: alert, NAD, NONTOXIC APPEARING. HEENT: eyes without injection, drainage, or swelling.  Ears: EACs clear, TMs with normal light reflex and landmarks.  Nose: No active rhinorrhea, with some dried, crusty exudate adherent to mildly injected mucosa.  No purulent d/c.  No paranasal sinus TTP.  No facial swelling.  Throat and mouth without focal lesion.  No pharyngial swelling, erythema, or exudate.   Neck: supple, no LAD.   LUNGS: CTA bilat, nonlabored resps.  Forceful expiratory maneuver does not elicit wheezing or coughing. CV: RRR, no m/r/g. EXT: no c/c/e SKIN: no rash   LABS:  Lab Results  Component Value Date   TSH 2.18 07/01/2016   Lab Results  Component Value Date   WBC 6.6 07/01/2016   HGB 14.6  07/01/2016   HCT 44.4 07/01/2016   MCV 91.0 07/01/2016   PLT 328.0 07/01/2016   Lab Results  Component Value Date   CREATININE 1.01 08/07/2016   BUN 17 08/07/2016   NA 140 08/07/2016   K 4.1 08/07/2016   CL 101 08/07/2016   CO2 23 08/07/2016   Lab Results  Component Value Date   ALT 16 07/01/2016   AST 19 07/01/2016   ALKPHOS 63 07/01/2016   BILITOT 0.9 07/01/2016   Lab Results  Component Value Date   CHOL 174 09/22/2016   Lab Results  Component Value Date   HDL 68.80 09/22/2016   Lab Results  Component Value Date   LDLCALC 82 09/22/2016   Lab Results  Component Value Date   TRIG 114.0 09/22/2016   Lab Results   Component Value Date   CHOLHDL 3 09/22/2016   Lab Results  Component Value Date   PSA 1.02 07/01/2016   Lab Results  Component Value Date   HGBA1C 5.9 07/02/2016    IMPRESSION AND PLAN:  Viral URI with PND cough---this is resolving appropriately. However, he is having acute bronchitis (w/out RAD component) at this time. Viral etiology suspected. Discussed natural course of this condition---mainly that it takes longer than a URI to get better/resolve. Discussed possible use of prednisone and he was amenable to this option. Prednisone 64m qd x 5d. Get otc generic robitussin DM OR Mucinex DM and use as directed on the packaging for cough and congestion. Use otc generic saline nasal spray 2-3 times per day to irrigate/moisturize your nasal passages.  An After Visit Summary was printed and given to the patient.  FOLLOW UP: Return if symptoms worsen or fail to improve.  Signed:  PCrissie Sickles MD           03/18/2017

## 2017-04-13 ENCOUNTER — Other Ambulatory Visit: Payer: Self-pay | Admitting: Family Medicine

## 2017-05-04 ENCOUNTER — Ambulatory Visit (INDEPENDENT_AMBULATORY_CARE_PROVIDER_SITE_OTHER): Payer: Medicare Other | Admitting: Family Medicine

## 2017-05-04 ENCOUNTER — Encounter: Payer: Self-pay | Admitting: Family Medicine

## 2017-05-04 VITALS — BP 133/80 | HR 72 | Temp 98.1°F | Resp 16 | Ht 68.0 in | Wt 188.8 lb

## 2017-05-04 DIAGNOSIS — B070 Plantar wart: Secondary | ICD-10-CM

## 2017-05-04 DIAGNOSIS — R7301 Impaired fasting glucose: Secondary | ICD-10-CM

## 2017-05-04 DIAGNOSIS — E7800 Pure hypercholesterolemia, unspecified: Secondary | ICD-10-CM | POA: Diagnosis not present

## 2017-05-04 DIAGNOSIS — I10 Essential (primary) hypertension: Secondary | ICD-10-CM | POA: Diagnosis not present

## 2017-05-04 LAB — LIPID PANEL
CHOL/HDL RATIO: 3
CHOLESTEROL: 193 mg/dL (ref 0–200)
HDL: 70.3 mg/dL (ref >39.00)
LDL CALC: 100 mg/dL — AB (ref 0–99)
NonHDL: 122.37
Triglycerides: 111 mg/dL (ref 0.0–149.0)
VLDL: 22.2 mg/dL (ref 0.0–40.0)

## 2017-05-04 LAB — BASIC METABOLIC PANEL
BUN: 15 mg/dL (ref 6–23)
CO2: 28 mEq/L (ref 19–32)
CREATININE: 0.94 mg/dL (ref 0.40–1.50)
Calcium: 9.4 mg/dL (ref 8.4–10.5)
Chloride: 104 mEq/L (ref 96–112)
GFR: 84.88 mL/min (ref >60.00)
GLUCOSE: 100 mg/dL — AB (ref 70–99)
POTASSIUM: 4.6 meq/L (ref 3.5–5.1)
Sodium: 138 mEq/L (ref 135–145)

## 2017-05-04 LAB — HEMOGLOBIN A1C: Hgb A1c MFr Bld: 5.9 % (ref 4.6–6.5)

## 2017-05-04 NOTE — Patient Instructions (Signed)
DASH Eating Plan DASH stands for "Dietary Approaches to Stop Hypertension." The DASH eating plan is a healthy eating plan that has been shown to reduce high blood pressure (hypertension). It may also reduce your risk for type 2 diabetes, heart disease, and stroke. The DASH eating plan may also help with weight loss. What are tips for following this plan? General guidelines  Avoid eating more than 2,300 mg (milligrams) of salt (sodium) a day. If you have hypertension, you may need to reduce your sodium intake to 1,500 mg a day.  Limit alcohol intake to no more than 1 drink a day for nonpregnant women and 2 drinks a day for men. One drink equals 12 oz of beer, 5 oz of wine, or 1 oz of hard liquor.  Work with your health care provider to maintain a healthy body weight or to lose weight. Ask what an ideal weight is for you.  Get at least 30 minutes of exercise that causes your heart to beat faster (aerobic exercise) most days of the week. Activities may include walking, swimming, or biking.  Work with your health care provider or diet and nutrition specialist (dietitian) to adjust your eating plan to your individual calorie needs. Reading food labels  Check food labels for the amount of sodium per serving. Choose foods with less than 5 percent of the Daily Value of sodium. Generally, foods with less than 300 mg of sodium per serving fit into this eating plan.  To find whole grains, look for the word "whole" as the first word in the ingredient list. Shopping  Buy products labeled as "low-sodium" or "no salt added."  Buy fresh foods. Avoid canned foods and premade or frozen meals. Cooking  Avoid adding salt when cooking. Use salt-free seasonings or herbs instead of table salt or sea salt. Check with your health care provider or pharmacist before using salt substitutes.  Do not fry foods. Cook foods using healthy methods such as baking, boiling, grilling, and broiling instead.  Cook with  heart-healthy oils, such as olive, canola, soybean, or sunflower oil. Meal planning   Eat a balanced diet that includes: ? 5 or more servings of fruits and vegetables each day. At each meal, try to fill half of your plate with fruits and vegetables. ? Up to 6-8 servings of whole grains each day. ? Less than 6 oz of lean meat, poultry, or fish each day. A 3-oz serving of meat is about the same size as a deck of cards. One egg equals 1 oz. ? 2 servings of low-fat dairy each day. ? A serving of nuts, seeds, or beans 5 times each week. ? Heart-healthy fats. Healthy fats called Omega-3 fatty acids are found in foods such as flaxseeds and coldwater fish, like sardines, salmon, and mackerel.  Limit how much you eat of the following: ? Canned or prepackaged foods. ? Food that is high in trans fat, such as fried foods. ? Food that is high in saturated fat, such as fatty meat. ? Sweets, desserts, sugary drinks, and other foods with added sugar. ? Full-fat dairy products.  Do not salt foods before eating.  Try to eat at least 2 vegetarian meals each week.  Eat more home-cooked food and less restaurant, buffet, and fast food.  When eating at a restaurant, ask that your food be prepared with less salt or no salt, if possible. What foods are recommended? The items listed may not be a complete list. Talk with your dietitian about what   dietary choices are best for you. Grains Whole-grain or whole-wheat bread. Whole-grain or whole-wheat pasta. Brown rice. Oatmeal. Quinoa. Bulgur. Whole-grain and low-sodium cereals. Pita bread. Low-fat, low-sodium crackers. Whole-wheat flour tortillas. Vegetables Fresh or frozen vegetables (raw, steamed, roasted, or grilled). Low-sodium or reduced-sodium tomato and vegetable juice. Low-sodium or reduced-sodium tomato sauce and tomato paste. Low-sodium or reduced-sodium canned vegetables. Fruits All fresh, dried, or frozen fruit. Canned fruit in natural juice (without  added sugar). Meat and other protein foods Skinless chicken or turkey. Ground chicken or turkey. Pork with fat trimmed off. Fish and seafood. Egg whites. Dried beans, peas, or lentils. Unsalted nuts, nut butters, and seeds. Unsalted canned beans. Lean cuts of beef with fat trimmed off. Low-sodium, lean deli meat. Dairy Low-fat (1%) or fat-free (skim) milk. Fat-free, low-fat, or reduced-fat cheeses. Nonfat, low-sodium ricotta or cottage cheese. Low-fat or nonfat yogurt. Low-fat, low-sodium cheese. Fats and oils Soft margarine without trans fats. Vegetable oil. Low-fat, reduced-fat, or light mayonnaise and salad dressings (reduced-sodium). Canola, safflower, olive, soybean, and sunflower oils. Avocado. Seasoning and other foods Herbs. Spices. Seasoning mixes without salt. Unsalted popcorn and pretzels. Fat-free sweets. What foods are not recommended? The items listed may not be a complete list. Talk with your dietitian about what dietary choices are best for you. Grains Baked goods made with fat, such as croissants, muffins, or some breads. Dry pasta or rice meal packs. Vegetables Creamed or fried vegetables. Vegetables in a cheese sauce. Regular canned vegetables (not low-sodium or reduced-sodium). Regular canned tomato sauce and paste (not low-sodium or reduced-sodium). Regular tomato and vegetable juice (not low-sodium or reduced-sodium). Pickles. Olives. Fruits Canned fruit in a light or heavy syrup. Fried fruit. Fruit in cream or butter sauce. Meat and other protein foods Fatty cuts of meat. Ribs. Fried meat. Bacon. Sausage. Bologna and other processed lunch meats. Salami. Fatback. Hotdogs. Bratwurst. Salted nuts and seeds. Canned beans with added salt. Canned or smoked fish. Whole eggs or egg yolks. Chicken or turkey with skin. Dairy Whole or 2% milk, cream, and half-and-half. Whole or full-fat cream cheese. Whole-fat or sweetened yogurt. Full-fat cheese. Nondairy creamers. Whipped toppings.  Processed cheese and cheese spreads. Fats and oils Butter. Stick margarine. Lard. Shortening. Ghee. Bacon fat. Tropical oils, such as coconut, palm kernel, or palm oil. Seasoning and other foods Salted popcorn and pretzels. Onion salt, garlic salt, seasoned salt, table salt, and sea salt. Worcestershire sauce. Tartar sauce. Barbecue sauce. Teriyaki sauce. Soy sauce, including reduced-sodium. Steak sauce. Canned and packaged gravies. Fish sauce. Oyster sauce. Cocktail sauce. Horseradish that you find on the shelf. Ketchup. Mustard. Meat flavorings and tenderizers. Bouillon cubes. Hot sauce and Tabasco sauce. Premade or packaged marinades. Premade or packaged taco seasonings. Relishes. Regular salad dressings. Where to find more information:  National Heart, Lung, and Blood Institute: www.nhlbi.nih.gov  American Heart Association: www.heart.org Summary  The DASH eating plan is a healthy eating plan that has been shown to reduce high blood pressure (hypertension). It may also reduce your risk for type 2 diabetes, heart disease, and stroke.  With the DASH eating plan, you should limit salt (sodium) intake to 2,300 mg a day. If you have hypertension, you may need to reduce your sodium intake to 1,500 mg a day.  When on the DASH eating plan, aim to eat more fresh fruits and vegetables, whole grains, lean proteins, low-fat dairy, and heart-healthy fats.  Work with your health care provider or diet and nutrition specialist (dietitian) to adjust your eating plan to your individual   calorie needs. This information is not intended to replace advice given to you by your health care provider. Make sure you discuss any questions you have with your health care provider. Document Released: 02/05/2011 Document Revised: 02/10/2016 Document Reviewed: 02/10/2016 Elsevier Interactive Patient Education  2018 [employer] Inc.  

## 2017-05-04 NOTE — Progress Notes (Signed)
OFFICE VISIT  05/04/2017   CC:  Chief Complaint  Patient presents with  . Follow-up    RCI, pt is fasting.      HPI:    Patient is a 67 y.o. Caucasian male who presents for f/u HTN, HLD, IFG.  HTN: home bp monitoring occasionally-avg <130/80.  IFG: see diet and exercise below.  No polyuria or polydipsia. HLD: taking lipitor daily w/out side effect. Exercise: restarted treadmill about 2 weeks ago; qd to qod. Diet: not eating low sodium or low carb/fat diet.  Has wart on bottom of R foot: just a bit bothersome when walking on treadmill, otherwise no bother. Has been there a couple months.  He has not applied anything to it.  ROS: no CP, no dizziness, no SOB, no LE swelling, no myalgias/arthralgias, no focal weakness or paresthesias.  Past Medical History:  Diagnosis Date  . AVM (arteriovenous malformation) of colon   . Colitis    sigmoid colon   . Diverticulosis of colon (without mention of hemorrhage)   . Esophageal stricture   . Essential hypertension 2018  . GERD (gastroesophageal reflux disease)   . Hiatal hernia   . Hypercholesterolemia    statin x 1 yr: tolerated med but was taken off it after 1 yr and lipids have been ok as of 2015.  Restarted atorva 06/2016.  . IFG (impaired fasting glucose)    Mild; HbA1c's consistently 5.9% or less.  . Personal history of colonic polyps 04/16/2010   tubulovillous adenomas    Past Surgical History:  Procedure Laterality Date  . colonoscopy  with polypectomy  2012; 10/2013   Tubular adenoma: recall 11/2018.  Marland Kitchen ESOPHAGOGASTRODUODENOSCOPY (EGD) WITH ESOPHAGEAL DILATION  05/2014  . VASECTOMY      Outpatient Medications Prior to Visit  Medication Sig Dispense Refill  . atorvastatin (LIPITOR) 20 MG tablet TAKE 1 TABLET BY MOUTH EVERY DAY 30 tablet 0  . ibuprofen (ADVIL,MOTRIN) 200 MG tablet Take 200 mg by mouth every 6 (six) hours as needed.    . irbesartan (AVAPRO) 75 MG tablet TAKE 1 TABLET BY MOUTH EVERY DAY 30 tablet 0  .  omeprazole (PRILOSEC) 40 MG capsule Take 1 capsule (40 mg total) by mouth daily. 30 capsule 12  . predniSONE (DELTASONE) 20 MG tablet 2 tabs po qd x 5d (Patient not taking: Reported on 05/04/2017) 10 tablet 0   No facility-administered medications prior to visit.     No Known Allergies  ROS As per HPI  PE: Blood pressure 133/80, pulse 72, temperature 98.1 F (36.7 C), temperature source Oral, resp. rate 16, height 5' 8"  (1.727 m), weight 188 lb 12 oz (85.6 kg), SpO2 100 %. Body mass index is 28.7 kg/m.  Gen: Alert, well appearing.  Patient is oriented to person, place, time, and situation. AFFECT: pleasant, lucid thought and speech. CV: RRR, no m/r/g.   LUNGS: CTA bilat, nonlabored resps, good aeration in all lung fields. EXT: no clubbing, cyanosis, or edema.  R foot plantar surface--about mid way point of 4th metatarsal--a small plantar wart without tenderness or erythema.  LABS:  Lab Results  Component Value Date   TSH 2.18 07/01/2016   Lab Results  Component Value Date   WBC 6.6 07/01/2016   HGB 14.6 07/01/2016   HCT 44.4 07/01/2016   MCV 91.0 07/01/2016   PLT 328.0 07/01/2016   Lab Results  Component Value Date   CREATININE 1.01 08/07/2016   BUN 17 08/07/2016   NA 140 08/07/2016   K  4.1 08/07/2016   CL 101 08/07/2016   CO2 23 08/07/2016   Lab Results  Component Value Date   ALT 16 07/01/2016   AST 19 07/01/2016   ALKPHOS 63 07/01/2016   BILITOT 0.9 07/01/2016   Lab Results  Component Value Date   CHOL 174 09/22/2016   Lab Results  Component Value Date   HDL 68.80 09/22/2016   Lab Results  Component Value Date   LDLCALC 82 09/22/2016   Lab Results  Component Value Date   TRIG 114.0 09/22/2016   Lab Results  Component Value Date   CHOLHDL 3 09/22/2016   Lab Results  Component Value Date   PSA 1.02 07/01/2016   Lab Results  Component Value Date   HGBA1C 5.9 07/02/2016    IMPRESSION AND PLAN:  1) HTN: The current medical regimen is  effective;  continue present plan and medications. Lytes/cr today. DASH diet handout reviewed and given to pt today.  2) Hyperlipidemia: tolerating statin.  Recheck FLP today.  AST/ALT normal 06/2016.  3) IFG: needs to improve diet.  He is doing fine on exercise but needs to increase intensity and stick with it consistently.  4) Plantar wart R foot: recommended otc compound W.  If not seeing improvement in 3-4 wks, he can return for cryo.  An After Visit Summary was printed and given to the patient.  FOLLOW UP: Return in about 6 months (around 11/04/2017) for routine chronic illness f/u.  Signed:  Crissie Sickles, MD           05/04/2017

## 2017-05-17 ENCOUNTER — Other Ambulatory Visit: Payer: Self-pay

## 2017-05-17 MED ORDER — IRBESARTAN 75 MG PO TABS: 75.0000 mg | ORAL_TABLET | Freq: Every day | ORAL | 6 refills | Status: DC

## 2017-05-17 MED ORDER — ATORVASTATIN CALCIUM 20 MG PO TABS: 20.0000 mg | ORAL_TABLET | Freq: Every day | ORAL | 6 refills | Status: DC

## 2017-07-13 ENCOUNTER — Other Ambulatory Visit: Payer: Self-pay | Admitting: Family Medicine

## 2017-11-04 ENCOUNTER — Ambulatory Visit (INDEPENDENT_AMBULATORY_CARE_PROVIDER_SITE_OTHER): Payer: Medicare Other | Admitting: Family Medicine

## 2017-11-04 ENCOUNTER — Encounter: Payer: Self-pay | Admitting: Family Medicine

## 2017-11-04 VITALS — BP 127/82 | HR 74 | Resp 16 | Ht 68.0 in | Wt 185.0 lb

## 2017-11-04 DIAGNOSIS — E7800 Pure hypercholesterolemia, unspecified: Secondary | ICD-10-CM

## 2017-11-04 DIAGNOSIS — Z23 Encounter for immunization: Secondary | ICD-10-CM | POA: Diagnosis not present

## 2017-11-04 DIAGNOSIS — R7301 Impaired fasting glucose: Secondary | ICD-10-CM

## 2017-11-04 DIAGNOSIS — Z125 Encounter for screening for malignant neoplasm of prostate: Secondary | ICD-10-CM | POA: Diagnosis not present

## 2017-11-04 DIAGNOSIS — I10 Essential (primary) hypertension: Secondary | ICD-10-CM | POA: Diagnosis not present

## 2017-11-04 DIAGNOSIS — Z0000 Encounter for general adult medical examination without abnormal findings: Secondary | ICD-10-CM

## 2017-11-04 LAB — COMPREHENSIVE METABOLIC PANEL
ALT: 23 U/L (ref 0–53)
AST: 22 U/L (ref 0–37)
Albumin: 4.4 g/dL (ref 3.5–5.2)
Alkaline Phosphatase: 59 U/L (ref 39–117)
BILIRUBIN TOTAL: 1 mg/dL (ref 0.2–1.2)
BUN: 14 mg/dL (ref 6–23)
CALCIUM: 9.3 mg/dL (ref 8.4–10.5)
CHLORIDE: 105 meq/L (ref 96–112)
CO2: 30 meq/L (ref 19–32)
Creatinine, Ser: 0.87 mg/dL (ref 0.40–1.50)
GFR: 92.67 mL/min (ref >60.00)
GLUCOSE: 116 mg/dL — AB (ref 70–99)
Potassium: 4.4 mEq/L (ref 3.5–5.1)
Sodium: 139 mEq/L (ref 135–145)
Total Protein: 6.9 g/dL (ref 6.0–8.3)

## 2017-11-04 LAB — LIPID PANEL
CHOL/HDL RATIO: 3
Cholesterol: 186 mg/dL (ref 0–200)
HDL: 74.1 mg/dL (ref >39.00)
LDL CALC: 87 mg/dL (ref 0–99)
NONHDL: 112.2
TRIGLYCERIDES: 127 mg/dL (ref 0.0–149.0)
VLDL: 25.4 mg/dL (ref 0.0–40.0)

## 2017-11-04 LAB — PSA, MEDICARE: PSA: 0.99 ng/ml (ref 0.10–4.00)

## 2017-11-04 LAB — HEMOGLOBIN A1C: Hgb A1c MFr Bld: 5.9 % (ref 4.6–6.5)

## 2017-11-04 MED ORDER — IRBESARTAN 75 MG PO TABS: 75.0000 mg | ORAL_TABLET | Freq: Every day | ORAL | 3 refills | Status: DC

## 2017-11-04 NOTE — Progress Notes (Signed)
OFFICE VISIT  11/04/2017   CC:  Chief Complaint  Patient presents with  . Follow-up    RCI     HPI:    Patient is a 67 y.o. Caucasian male who presents for 6 mo f/u HTN, HLD, IFG.  HTN: consistently less than 130/80. HLD: compliant with atorva, no side effects. He is eating a fairly healthy diet and trying to remain active.  No acute complaints.  ROS: no CP, no SOB, no wheezing, no cough, no dizziness, no HAs, no rashes, no melena/hematochezia.  No polyuria or polydipsia.  No myalgias or arthralgias.   Past Medical History:  Diagnosis Date  . AVM (arteriovenous malformation) of colon   . Colitis    sigmoid colon   . Diverticulosis of colon (without mention of hemorrhage)   . Esophageal stricture   . Essential hypertension 2018  . GERD (gastroesophageal reflux disease)   . Hiatal hernia   . Hypercholesterolemia    statin x 1 yr: tolerated med but was taken off it after 1 yr and lipids have been ok as of 2015.  Restarted atorva 06/2016.  . IFG (impaired fasting glucose)    Mild; HbA1c's consistently 5.9% or less.  . Personal history of colonic polyps 04/16/2010   tubulovillous adenomas    Past Surgical History:  Procedure Laterality Date  . colonoscopy  with polypectomy  2012; 10/2013   Tubular adenoma: recall 11/2018.  Marland Kitchen ESOPHAGOGASTRODUODENOSCOPY (EGD) WITH ESOPHAGEAL DILATION  05/2014  . VASECTOMY      Outpatient Medications Prior to Visit  Medication Sig Dispense Refill  . atorvastatin (LIPITOR) 20 MG tablet Take 1 tablet (20 mg total) by mouth daily. 30 tablet 6  . ibuprofen (ADVIL,MOTRIN) 200 MG tablet Take 200 mg by mouth every 6 (six) hours as needed.    Marland Kitchen omeprazole (PRILOSEC) 40 MG capsule TAKE 1 CAPSULE (40 MG TOTAL) BY MOUTH DAILY. 30 capsule 5  . irbesartan (AVAPRO) 75 MG tablet Take 1 tablet (75 mg total) by mouth daily. 30 tablet 6   No facility-administered medications prior to visit.     No Known Allergies  ROS As per HPI  PE: Blood  pressure 127/82, pulse 74, resp. rate 16, height 5' 8"  (1.727 m), weight 185 lb (83.9 kg), SpO2 97 %. Gen: Alert, well appearing.  Patient is oriented to person, place, time, and situation. AFFECT: pleasant, lucid thought and speech. CV: RRR, no m/r/g.   LUNGS: CTA bilat, nonlabored resps, good aeration in all lung fields. EXT: no clubbing or cyanosis.  no edema.    LABS:  Lab Results  Component Value Date   TSH 2.18 07/01/2016   Lab Results  Component Value Date   WBC 6.6 07/01/2016   HGB 14.6 07/01/2016   HCT 44.4 07/01/2016   MCV 91.0 07/01/2016   PLT 328.0 07/01/2016   Lab Results  Component Value Date   CREATININE 0.94 05/04/2017   BUN 15 05/04/2017   NA 138 05/04/2017   K 4.6 05/04/2017   CL 104 05/04/2017   CO2 28 05/04/2017   Lab Results  Component Value Date   ALT 16 07/01/2016   AST 19 07/01/2016   ALKPHOS 63 07/01/2016   BILITOT 0.9 07/01/2016   Lab Results  Component Value Date   CHOL 193 05/04/2017   Lab Results  Component Value Date   HDL 70.30 05/04/2017   Lab Results  Component Value Date   LDLCALC 100 (H) 05/04/2017   Lab Results  Component Value Date  TRIG 111.0 05/04/2017   Lab Results  Component Value Date   CHOLHDL 3 05/04/2017   Lab Results  Component Value Date   PSA 1.02 07/01/2016   Lab Results  Component Value Date   HGBA1C 5.9 05/04/2017    IMPRESSION AND PLAN:  1) HTN: The current medical regimen is effective;  continue present plan and medications. Lytes/cr today.  2) HLD: tolerating statin. FLP and hepatic panel today.  3) Prediabetes/IFG: follow HbA1c today in addition to the fasting glucose on his CMET.  4) Preventative health care: Prostate ca screening: pt declined DRE today but did want PSA so I ordered this today. Due for flu vaccine-->  Given today. Due for pneumovax 23-->given today.  An After Visit Summary was printed and given to the patient.  FOLLOW UP: Return in about 6 months (around  05/05/2018) for routine chronic illness f/u.  Signed:  Crissie Sickles, MD           11/04/2017

## 2017-11-04 NOTE — Addendum Note (Signed)
Addended by: Gordy Councilman on: 11/04/2017 09:17 AM   Modules accepted: Orders

## 2017-11-11 ENCOUNTER — Other Ambulatory Visit: Payer: Self-pay | Admitting: Family Medicine

## 2018-03-23 ENCOUNTER — Ambulatory Visit: Payer: Medicare Other | Admitting: Family Medicine

## 2018-03-24 ENCOUNTER — Encounter: Payer: Self-pay | Admitting: Family Medicine

## 2018-03-24 ENCOUNTER — Ambulatory Visit (INDEPENDENT_AMBULATORY_CARE_PROVIDER_SITE_OTHER): Payer: Medicare Other | Admitting: Family Medicine

## 2018-03-24 VITALS — BP 132/69 | HR 74 | Temp 97.9°F | Resp 16 | Ht 68.0 in | Wt 186.2 lb

## 2018-03-24 DIAGNOSIS — R21 Rash and other nonspecific skin eruption: Secondary | ICD-10-CM

## 2018-03-24 NOTE — Progress Notes (Signed)
OFFICE VISIT  03/24/2018   CC:  Chief Complaint  Patient presents with  . Rash    on feet   HPI:    Patient is a 68 y.o. Caucasian male who presents for foot rash. L foot >>right.  Onset 6 mo ago--dissipated spontaneously, then returned about 1 mo ago. No itching or pain.  He tried some cream his wife had--?steroid?--for a few days and no change noted.  No systemic sx's such as fatigue, fever/chills, or undesired wt loss.  No new meds.  Past Medical History:  Diagnosis Date  . AVM (arteriovenous malformation) of colon   . Colitis    sigmoid colon   . Diverticulosis of colon (without mention of hemorrhage)   . Esophageal stricture   . Essential hypertension 2018  . GERD (gastroesophageal reflux disease)   . Hiatal hernia   . Hypercholesterolemia    statin x 1 yr: tolerated med but was taken off it after 1 yr and lipids have been ok as of 2015.  Restarted atorva 06/2016.  . IFG (impaired fasting glucose)    Mild; HbA1c's consistently 5.9% or less.  . Personal history of colonic polyps 04/16/2010   tubulovillous adenomas    Past Surgical History:  Procedure Laterality Date  . colonoscopy  with polypectomy  2012; 10/2013   Tubular adenoma: recall 11/2018.  Marland Kitchen ESOPHAGOGASTRODUODENOSCOPY (EGD) WITH ESOPHAGEAL DILATION  05/2014  . VASECTOMY      Outpatient Medications Prior to Visit  Medication Sig Dispense Refill  . atorvastatin (LIPITOR) 20 MG tablet TAKE 1 TABLET BY MOUTH EVERY DAY 90 tablet 1  . ibuprofen (ADVIL,MOTRIN) 200 MG tablet Take 200 mg by mouth every 6 (six) hours as needed.    . irbesartan (AVAPRO) 75 MG tablet Take 1 tablet (75 mg total) by mouth daily. 90 tablet 3  . omeprazole (PRILOSEC) 40 MG capsule TAKE 1 CAPSULE (40 MG TOTAL) BY MOUTH DAILY. 90 capsule 1   No facility-administered medications prior to visit.     No Known Allergies  ROS As per HPI  PE: Blood pressure 132/69, pulse 74, temperature 97.9 F (36.6 C), temperature source Oral, resp.  rate 16, height 5' 8"  (1.727 m), weight 186 lb 4 oz (84.5 kg), SpO2 100 %. Gen: Alert, well appearing.  Patient is oriented to person, place, time, and situation. AFFECT: pleasant, lucid thought and speech. L foot: tiny papules fleshy-to-pink colored (not dome shaped), with central white plug in some and some with central tiny ulceration.  No definite appearance of central umbilication.  No excoriations.  The skin is fairly dry. Location of rash is on dorsum of foot and toes of L foot and on medial aspect of R foot.   No vesicles, pustules, petechiae, or hives.  No joint swelling or erythema.  No pedal edema.  LABS:    Chemistry      Component Value Date/Time   NA 139 11/04/2017 0912   K 4.4 11/04/2017 0912   CL 105 11/04/2017 0912   CO2 30 11/04/2017 0912   BUN 14 11/04/2017 0912   CREATININE 0.87 11/04/2017 0912   CREATININE 1.01 08/07/2016 1546      Component Value Date/Time   CALCIUM 9.3 11/04/2017 0912   ALKPHOS 59 11/04/2017 0912   AST 22 11/04/2017 0912   ALT 23 11/04/2017 0912   BILITOT 1.0 11/04/2017 0912     Lab Results  Component Value Date   HGBA1C 5.9 11/04/2017   IMPRESSION AND PLAN:  Rash on L foot>>R  foot x 1 month, asymptomatic. Unknown etiology. Has slight resemblance to molluscum but this would be very unlikely in this patient. Atypical fungal rash is also possible. Will do trial of otc lamisil bid for 1 month and he is to call if no improvement seen at that time. Refer to derm is next step.  An After Visit Summary was printed and given to the patient.  FOLLOW UP: Return if symptoms worsen or fail to improve in 1 month.  Signed:  Crissie Sickles, MD           03/24/2018

## 2018-03-24 NOTE — Patient Instructions (Signed)
Apply over the counter lamisil cream twice a day. It may take 6 weeks for this to completely resolve.  If not improved any with 1 month of lamisil use, call or return.

## 2018-05-05 ENCOUNTER — Ambulatory Visit (INDEPENDENT_AMBULATORY_CARE_PROVIDER_SITE_OTHER): Payer: Medicare Other | Admitting: Family Medicine

## 2018-05-05 ENCOUNTER — Encounter: Payer: Self-pay | Admitting: Family Medicine

## 2018-05-05 VITALS — BP 150/73 | HR 74 | Temp 98.3°F | Resp 16 | Ht 68.0 in | Wt 189.4 lb

## 2018-05-05 DIAGNOSIS — K219 Gastro-esophageal reflux disease without esophagitis: Secondary | ICD-10-CM

## 2018-05-05 DIAGNOSIS — Z1283 Encounter for screening for malignant neoplasm of skin: Secondary | ICD-10-CM | POA: Diagnosis not present

## 2018-05-05 DIAGNOSIS — E663 Overweight: Secondary | ICD-10-CM

## 2018-05-05 DIAGNOSIS — E7800 Pure hypercholesterolemia, unspecified: Secondary | ICD-10-CM

## 2018-05-05 DIAGNOSIS — I10 Essential (primary) hypertension: Secondary | ICD-10-CM

## 2018-05-05 DIAGNOSIS — Z8719 Personal history of other diseases of the digestive system: Secondary | ICD-10-CM | POA: Diagnosis not present

## 2018-05-05 NOTE — Progress Notes (Signed)
OFFICE VISIT  05/05/2018   CC:  Chief Complaint  Patient presents with  . Follow-up    RCI, pt is fasting.    HPI:    Patient is a 68 y.o. Caucasian male who presents for 6 mo f/u HTN, HLD. He also has GERD with history of esophageal stricture with dilatation required in the past.  GERD: prilosec 2m qAM.  Diet: eats GERD-friendly diet. No dysphagia, odynophagia, or regurgitation.    HTN: occ home bp check ->120s/70s consistently.  HLD: tolerating statin.  Diet improved signif since retirement since he is not traveling much. Walking daily for a couple months.  Feeling better but no wt loss.  ROS: no CP, no SOB, no wheezing, no cough, no dizziness, no HAs, no rashes, no melena/hematochezia.  No polyuria or polydipsia.  No myalgias or arthralgias.   Past Medical History:  Diagnosis Date  . AVM (arteriovenous malformation) of colon   . Colitis    sigmoid colon   . Diverticulosis of colon (without mention of hemorrhage)   . Esophageal stricture   . Essential hypertension 2018  . GERD (gastroesophageal reflux disease)   . Hiatal hernia   . Hypercholesterolemia    statin x 1 yr: tolerated med but was taken off it after 1 yr and lipids have been ok as of 2015.  Restarted atorva 06/2016.  . IFG (impaired fasting glucose)    Mild; HbA1c's consistently 5.9% or less.  . Personal history of colonic polyps 04/16/2010   tubulovillous adenomas    Past Surgical History:  Procedure Laterality Date  . colonoscopy  with polypectomy  2012; 10/2013   Tubular adenoma: recall 11/2018.  .Marland KitchenESOPHAGOGASTRODUODENOSCOPY (EGD) WITH ESOPHAGEAL DILATION  05/2014  . VASECTOMY      Outpatient Medications Prior to Visit  Medication Sig Dispense Refill  . atorvastatin (LIPITOR) 20 MG tablet TAKE 1 TABLET BY MOUTH EVERY DAY 90 tablet 1  . ibuprofen (ADVIL,MOTRIN) 200 MG tablet Take 200 mg by mouth every 6 (six) hours as needed.    . irbesartan (AVAPRO) 75 MG tablet Take 1 tablet (75 mg total) by mouth  daily. 90 tablet 3  . omeprazole (PRILOSEC) 40 MG capsule TAKE 1 CAPSULE (40 MG TOTAL) BY MOUTH DAILY. 90 capsule 1   No facility-administered medications prior to visit.     No Known Allergies  ROS As per HPI  PPQ:ZRAQTMAbp 150/73; manual recheck at end of visit 138/84. Blood pressure (!) 150/73, pulse 74, temperature 98.3 F (36.8 C), temperature source Oral, resp. rate 16, height 5' 8"  (1.727 m), weight 189 lb 6 oz (85.9 kg), SpO2 100 %. Body mass index is 28.79 kg/m.  Gen: Alert, well appearing.  Patient is oriented to person, place, time, and situation.  AFFECT: pleasant, lucid thought and speech.  No further exam today.  LABS:  Lab Results  Component Value Date   TSH 2.18 07/01/2016   Lab Results  Component Value Date   WBC 6.6 07/01/2016   HGB 14.6 07/01/2016   HCT 44.4 07/01/2016   MCV 91.0 07/01/2016   PLT 328.0 07/01/2016   Lab Results  Component Value Date   CREATININE 0.87 11/04/2017   BUN 14 11/04/2017   NA 139 11/04/2017   K 4.4 11/04/2017   CL 105 11/04/2017   CO2 30 11/04/2017   Lab Results  Component Value Date   ALT 23 11/04/2017   AST 22 11/04/2017   ALKPHOS 59 11/04/2017   BILITOT 1.0 11/04/2017   Lab Results  Component Value Date   CHOL 186 11/04/2017   Lab Results  Component Value Date   HDL 74.10 11/04/2017   Lab Results  Component Value Date   LDLCALC 87 11/04/2017   Lab Results  Component Value Date   TRIG 127.0 11/04/2017   Lab Results  Component Value Date   CHOLHDL 3 11/04/2017   Lab Results  Component Value Date   PSA 0.99 11/04/2017   PSA 1.02 07/01/2016   Lab Results  Component Value Date   HGBA1C 5.9 11/04/2017    IMPRESSION AND PLAN:  1) HTN: home control is good, mildly elevated in office. Encouraged more frequent home monitoring (weekly), reiterated goal <130/80 consistently. Call or return if not consistently at goal bp on home monitoring. No labs today.  Recheck 6 mo.  2) Hyperlipidemia:  tolerating statin.  Adhering to some dietary improvements. Walking regularly but would benefit from increased exercise.  3) GERD, with hx of esoph stricture: doing well.  Continue GERD-friendly diet and daily PPI.  4) Skin ca screening: Ref to derm per pt request.  No changes made today.  An After Visit Summary was printed and given to the patient.  FOLLOW UP: Return in about 6 months (around 11/05/2018) for routine chronic illness f/u.  Signed:  Crissie Sickles, MD           05/05/2018

## 2018-05-07 ENCOUNTER — Other Ambulatory Visit: Payer: Self-pay | Admitting: Family Medicine

## 2018-05-10 ENCOUNTER — Encounter: Payer: Self-pay | Admitting: Family Medicine

## 2018-10-10 ENCOUNTER — Encounter: Payer: Self-pay | Admitting: Gastroenterology

## 2018-11-06 ENCOUNTER — Other Ambulatory Visit: Payer: Self-pay | Admitting: Family Medicine

## 2018-11-08 ENCOUNTER — Ambulatory Visit: Payer: Medicare Other | Admitting: Family Medicine

## 2018-11-11 ENCOUNTER — Other Ambulatory Visit: Payer: Self-pay | Admitting: Family Medicine

## 2018-11-14 DIAGNOSIS — Z23 Encounter for immunization: Secondary | ICD-10-CM | POA: Diagnosis not present

## 2019-02-08 ENCOUNTER — Encounter: Payer: Self-pay | Admitting: Family Medicine

## 2019-02-08 ENCOUNTER — Other Ambulatory Visit: Payer: Self-pay

## 2019-02-08 ENCOUNTER — Ambulatory Visit (INDEPENDENT_AMBULATORY_CARE_PROVIDER_SITE_OTHER): Payer: Medicare Other | Admitting: Family Medicine

## 2019-02-08 VITALS — BP 131/86 | HR 63 | Temp 98.7°F | Resp 16 | Ht 68.0 in | Wt 193.0 lb

## 2019-02-08 DIAGNOSIS — Z8719 Personal history of other diseases of the digestive system: Secondary | ICD-10-CM | POA: Diagnosis not present

## 2019-02-08 DIAGNOSIS — K219 Gastro-esophageal reflux disease without esophagitis: Secondary | ICD-10-CM | POA: Diagnosis not present

## 2019-02-08 DIAGNOSIS — Z125 Encounter for screening for malignant neoplasm of prostate: Secondary | ICD-10-CM | POA: Diagnosis not present

## 2019-02-08 DIAGNOSIS — R7301 Impaired fasting glucose: Secondary | ICD-10-CM | POA: Diagnosis not present

## 2019-02-08 DIAGNOSIS — E7800 Pure hypercholesterolemia, unspecified: Secondary | ICD-10-CM | POA: Diagnosis not present

## 2019-02-08 DIAGNOSIS — Z1211 Encounter for screening for malignant neoplasm of colon: Secondary | ICD-10-CM | POA: Diagnosis not present

## 2019-02-08 DIAGNOSIS — I10 Essential (primary) hypertension: Secondary | ICD-10-CM

## 2019-02-08 LAB — LIPID PANEL
Cholesterol: 194 mg/dL (ref 0–200)
HDL: 62.8 mg/dL (ref >39.00)
LDL Cholesterol: 101 mg/dL — ABNORMAL HIGH (ref 0–99)
NonHDL: 131.2
Total CHOL/HDL Ratio: 3
Triglycerides: 151 mg/dL — ABNORMAL HIGH (ref 0.0–149.0)
VLDL: 30.2 mg/dL (ref 0.0–40.0)

## 2019-02-08 LAB — COMPREHENSIVE METABOLIC PANEL
ALT: 25 U/L (ref 0–53)
AST: 22 U/L (ref 0–37)
Albumin: 4.4 g/dL (ref 3.5–5.2)
Alkaline Phosphatase: 69 U/L (ref 39–117)
BUN: 18 mg/dL (ref 6–23)
CO2: 29 mEq/L (ref 19–32)
Calcium: 9.4 mg/dL (ref 8.4–10.5)
Chloride: 103 mEq/L (ref 96–112)
Creatinine, Ser: 1 mg/dL (ref 0.40–1.50)
GFR: 73.97 mL/min (ref >60.00)
Glucose, Bld: 113 mg/dL — ABNORMAL HIGH (ref 70–99)
Potassium: 4.8 mEq/L (ref 3.5–5.1)
Sodium: 139 mEq/L (ref 135–145)
Total Bilirubin: 0.9 mg/dL (ref 0.2–1.2)
Total Protein: 7.2 g/dL (ref 6.0–8.3)

## 2019-02-08 LAB — CBC WITH DIFFERENTIAL/PLATELET
Basophils Absolute: 0.1 10*3/uL (ref 0.0–0.1)
Basophils Relative: 1.1 % (ref 0.0–3.0)
Eosinophils Absolute: 0.3 10*3/uL (ref 0.0–0.7)
Eosinophils Relative: 3.4 % (ref 0.0–5.0)
HCT: 43.5 % (ref 39.0–52.0)
Hemoglobin: 14.3 g/dL (ref 13.0–17.0)
Lymphocytes Relative: 42.9 % (ref 12.0–46.0)
Lymphs Abs: 3.5 10*3/uL (ref 0.7–4.0)
MCHC: 32.8 g/dL (ref 30.0–36.0)
MCV: 91 fl (ref 78.0–100.0)
Monocytes Absolute: 0.9 10*3/uL (ref 0.1–1.0)
Monocytes Relative: 11.3 % (ref 3.0–12.0)
Neutro Abs: 3.3 10*3/uL (ref 1.4–7.7)
Neutrophils Relative %: 41.3 % — ABNORMAL LOW (ref 43.0–77.0)
Platelets: 305 10*3/uL (ref 150.0–400.0)
RBC: 4.78 Mil/uL (ref 4.22–5.81)
RDW: 13.6 % (ref 11.5–15.5)
WBC: 8.1 10*3/uL (ref 4.0–10.5)

## 2019-02-08 LAB — PSA, MEDICARE: PSA: 1.45 ng/ml (ref 0.10–4.00)

## 2019-02-08 LAB — HEMOGLOBIN A1C: Hgb A1c MFr Bld: 5.9 % (ref 4.6–6.5)

## 2019-02-08 NOTE — Progress Notes (Signed)
OFFICE VISIT  02/08/2019   CC:  Chief Complaint  Patient presents with  . Follow-up    RCI, pt is fasting   HPI:    Patient is a 68 y.o. Caucasian male who presents for f/u HTN, HLD, IFG, and GERD w/history of esoph stricture/dilation in 2016.  HTN: home monitoring typically <120/70s  HLD atorvastatin->tolerating.    GERD: no issues lately, statin PPI daily before supper.  No dysphagia.  Prediabetic: since pandemic, eating more and exercising less.  Past Medical History:  Diagnosis Date  . AVM (arteriovenous malformation) of colon   . Colitis    sigmoid colon   . Diverticulosis of colon (without mention of hemorrhage)   . Esophageal stricture   . Essential hypertension 2018  . GERD (gastroesophageal reflux disease)   . Hiatal hernia   . Hypercholesterolemia    statin x 1 yr: tolerated med but was taken off it after 1 yr and lipids have been ok as of 2015.  Restarted atorva 06/2016.  . IFG (impaired fasting glucose)    Mild; HbA1c's consistently 5.9% or less.  . Personal history of colonic polyps 04/16/2010   tubulovillous adenomas    Past Surgical History:  Procedure Laterality Date  . colonoscopy  with polypectomy  2012; 10/2013   Tubular adenoma: recall 11/2018.  Marland Kitchen ESOPHAGOGASTRODUODENOSCOPY (EGD) WITH ESOPHAGEAL DILATION  05/2014  . VASECTOMY      Outpatient Medications Prior to Visit  Medication Sig Dispense Refill  . atorvastatin (LIPITOR) 20 MG tablet TAKE 1 TABLET BY MOUTH EVERY DAY 90 tablet 1  . irbesartan (AVAPRO) 75 MG tablet TAKE 1 TABLET BY MOUTH EVERY DAY 90 tablet 1  . omeprazole (PRILOSEC) 40 MG capsule TAKE 1 CAPSULE BY MOUTH EVERY DAY 90 capsule 1  . ibuprofen (ADVIL,MOTRIN) 200 MG tablet Take 200 mg by mouth every 6 (six) hours as needed.     No facility-administered medications prior to visit.     No Known Allergies  ROS ROS: no CP, no SOB, no wheezing, no cough, no dizziness, no HAs, no rashes, no melena/hematochezia.  No polyuria or  polydipsia.  No myalgias or arthralgias.   PE: Blood pressure 131/86, pulse 63, temperature 98.7 F (37.1 C), temperature source Temporal, resp. rate 16, height 5' 8"  (1.727 m), weight 193 lb (87.5 kg), SpO2 99 %. Body mass index is 29.35 kg/m.  Gen: Alert, well appearing.  Patient is oriented to person, place, time, and situation. AFFECT: pleasant, lucid thought and speech. CV: RRR, no m/r/g.   LUNGS: CTA bilat, nonlabored resps, good aeration in all lung fields. EXT: no clubbing or cyanosis.  no edema.    LABS:  Lab Results  Component Value Date   TSH 2.18 07/01/2016   Lab Results  Component Value Date   WBC 6.6 07/01/2016   HGB 14.6 07/01/2016   HCT 44.4 07/01/2016   MCV 91.0 07/01/2016   PLT 328.0 07/01/2016   Lab Results  Component Value Date   CREATININE 0.87 11/04/2017   BUN 14 11/04/2017   NA 139 11/04/2017   K 4.4 11/04/2017   CL 105 11/04/2017   CO2 30 11/04/2017   Lab Results  Component Value Date   ALT 23 11/04/2017   AST 22 11/04/2017   ALKPHOS 59 11/04/2017   BILITOT 1.0 11/04/2017   Lab Results  Component Value Date   CHOL 186 11/04/2017   Lab Results  Component Value Date   HDL 74.10 11/04/2017   Lab Results  Component Value  Date   LDLCALC 87 11/04/2017   Lab Results  Component Value Date   TRIG 127.0 11/04/2017   Lab Results  Component Value Date   CHOLHDL 3 11/04/2017   Lab Results  Component Value Date   PSA 0.99 11/04/2017   PSA 1.02 07/01/2016   Lab Results  Component Value Date   HGBA1C 5.9 11/04/2017   IMPRESSION AND PLAN:  1) HTN: The current medical regimen is effective;  continue present plan and medications. Lytes/cr today.  2) HLD: tolerating statin. FLP and hepatic panel today.  3) GERD: asymptomatic as long as he's taking daily PPI and adjusting diet.  4) Prediabetes: definitely needs to increase efforts at TLC. Fasting glucose and HbA1c today.  5) Preventative health care: prostate ca screening-->he  did want PSA toda but he declined DRE today. Vaccines all UTD except shingrix, which we discussed today and he wants to defer for now.  Prev health care: flu vaccine UTD.  He deferred shingrix for now. Due for repeat colonoscopy->he will consider timing of repeat given covid circumstances.  An After Visit Summary was printed and given to the patient.  FOLLOW UP: No follow-ups on file.  Signed:  Crissie Sickles, MD           02/08/2019

## 2019-04-02 ENCOUNTER — Ambulatory Visit: Payer: Medicare Other

## 2019-04-08 ENCOUNTER — Ambulatory Visit: Payer: Medicare Other | Attending: Internal Medicine

## 2019-04-08 DIAGNOSIS — Z23 Encounter for immunization: Secondary | ICD-10-CM | POA: Insufficient documentation

## 2019-04-08 NOTE — Progress Notes (Signed)
   Covid-19 Vaccination Clinic  Name:  Stephen Bryant    MRN: [id number] DOB: 12-21-1950  04/08/2019  [name] was observed post Covid-19 immunization for 15 minutes without incidence. He was provided with Vaccine Information Sheet and instruction to access the V-Safe system.   Mr. Gut was instructed to call 911 with any severe reactions post vaccine: Marland Kitchen Difficulty breathing  . Swelling of your face and throat  . A fast heartbeat  . A bad rash all over your body  . Dizziness and weakness    Immunizations Administered    Name Date Dose VIS Date Route   Pfizer COVID-19 Vaccine 04/08/2019  5:50 PM 0.3 mL 02/10/2019 Intramuscular   Manufacturer: [place]   Lot: [id number]   [place]: [id number]

## 2019-04-13 ENCOUNTER — Ambulatory Visit: Payer: Medicare Other

## 2019-05-03 ENCOUNTER — Ambulatory Visit: Payer: Medicare Other | Attending: Internal Medicine

## 2019-05-03 DIAGNOSIS — Z23 Encounter for immunization: Secondary | ICD-10-CM | POA: Insufficient documentation

## 2019-05-03 NOTE — Progress Notes (Signed)
   Covid-19 Vaccination Clinic  Name:  Stephen Bryant    MRN: [id number] DOB: Jul 12, 1950  05/03/2019  Mr. Stephen Bryant was observed post Covid-19 immunization for 15 minutes without incident. He was provided with Vaccine Information Sheet and instruction to access the V-Safe system.   Mr. Stephen Bryant was instructed to call 911 with any severe reactions post vaccine: Marland Kitchen Difficulty breathing  . Swelling of face and throat  . A fast heartbeat  . A bad rash all over body  . Dizziness and weakness   Immunizations Administered    Name Date Dose VIS Date Route   Pfizer COVID-19 Vaccine 05/03/2019  2:45 PM 0.3 mL 02/10/2019 Intramuscular   Manufacturer: [place]   Lot: [id number]   [place]: [id number]

## 2019-05-07 ENCOUNTER — Other Ambulatory Visit: Payer: Self-pay | Admitting: Family Medicine

## 2019-07-13 ENCOUNTER — Ambulatory Visit (INDEPENDENT_AMBULATORY_CARE_PROVIDER_SITE_OTHER): Payer: Medicare Other | Admitting: Nurse Practitioner

## 2019-07-13 ENCOUNTER — Other Ambulatory Visit (INDEPENDENT_AMBULATORY_CARE_PROVIDER_SITE_OTHER): Payer: Medicare Other

## 2019-07-13 ENCOUNTER — Encounter: Payer: Self-pay | Admitting: Nurse Practitioner

## 2019-07-13 VITALS — BP 136/80 | HR 88 | Temp 98.5°F | Ht 67.0 in | Wt 187.0 lb

## 2019-07-13 DIAGNOSIS — K5732 Diverticulitis of large intestine without perforation or abscess without bleeding: Secondary | ICD-10-CM | POA: Diagnosis not present

## 2019-07-13 DIAGNOSIS — K5792 Diverticulitis of intestine, part unspecified, without perforation or abscess without bleeding: Secondary | ICD-10-CM | POA: Diagnosis not present

## 2019-07-13 DIAGNOSIS — R1032 Left lower quadrant pain: Secondary | ICD-10-CM

## 2019-07-13 LAB — CBC WITH DIFFERENTIAL/PLATELET
Basophils Absolute: 0.1 10*3/uL (ref 0.0–0.1)
Basophils Relative: 0.8 % (ref 0.0–3.0)
Eosinophils Absolute: 0.2 10*3/uL (ref 0.0–0.7)
Eosinophils Relative: 1.9 % (ref 0.0–5.0)
HCT: 41.9 % (ref 39.0–52.0)
Hemoglobin: 14.1 g/dL (ref 13.0–17.0)
Lymphocytes Relative: 31.2 % (ref 12.0–46.0)
Lymphs Abs: 2.7 10*3/uL (ref 0.7–4.0)
MCHC: 33.6 g/dL (ref 30.0–36.0)
MCV: 89.6 fl (ref 78.0–100.0)
Monocytes Absolute: 1.2 10*3/uL — ABNORMAL HIGH (ref 0.1–1.0)
Monocytes Relative: 13.5 % — ABNORMAL HIGH (ref 3.0–12.0)
Neutro Abs: 4.5 10*3/uL (ref 1.4–7.7)
Neutrophils Relative %: 52.6 % (ref 43.0–77.0)
Platelets: 299 10*3/uL (ref 150.0–400.0)
RBC: 4.67 Mil/uL (ref 4.22–5.81)
RDW: 14 % (ref 11.5–15.5)
WBC: 8.6 10*3/uL (ref 4.0–10.5)

## 2019-07-13 LAB — COMPREHENSIVE METABOLIC PANEL
ALT: 23 U/L (ref 0–53)
AST: 22 U/L (ref 0–37)
Albumin: 4.5 g/dL (ref 3.5–5.2)
Alkaline Phosphatase: 72 U/L (ref 39–117)
BUN: 14 mg/dL (ref 6–23)
CO2: 29 mEq/L (ref 19–32)
Calcium: 9.2 mg/dL (ref 8.4–10.5)
Chloride: 104 mEq/L (ref 96–112)
Creatinine, Ser: 0.93 mg/dL (ref 0.40–1.50)
GFR: 80.33 mL/min (ref >60.00)
Glucose, Bld: 94 mg/dL (ref 70–99)
Potassium: 3.8 mEq/L (ref 3.5–5.1)
Sodium: 140 mEq/L (ref 135–145)
Total Bilirubin: 0.5 mg/dL (ref 0.2–1.2)
Total Protein: 7.6 g/dL (ref 6.0–8.3)

## 2019-07-13 LAB — C-REACTIVE PROTEIN: CRP: <1 mg/dL (ref 0.5–20.0)

## 2019-07-13 MED ORDER — METRONIDAZOLE 500 MG PO TABS
500.0000 mg | ORAL_TABLET | Freq: Two times a day (BID) | ORAL | 0 refills | Status: DC
Start: 2019-07-13 — End: 2019-08-16

## 2019-07-13 MED ORDER — CIPROFLOXACIN HCL 500 MG PO TABS
500.0000 mg | ORAL_TABLET | Freq: Two times a day (BID) | ORAL | 0 refills | Status: DC
Start: 2019-07-13 — End: 2019-08-16

## 2019-07-13 NOTE — Patient Instructions (Addendum)
If you are age 70 or older, your body mass index should be between 23-30. Your Body mass index is 29.29 kg/m. If this is out of the aforementioned range listed, please consider follow up with your Primary Care Provider.  If you are age 71 or younger, your body mass index should be between 19-25. Your Body mass index is 29.29 kg/m. If this is out of the aformentioned range listed, please consider follow up with your Primary Care Provider.   Your provider has requested that you go to the basement level for lab work before leaving today. Press "B" on the elevator. The lab is located at the first door on the left as you exit the elevator.  We have sent the following medications to your pharmacy for you to pick up at your convenience:  Cipro 500 mg twice a day Metronidozole 500 mg twice daily  Push fluids, maintain a low residue diet. I have enclosed a handout.  You have been scheduled for a CT scan of the abdomen and pelvis at [facility], 1st floor Radiology.  You are scheduled on 07/14/2019 at 8:00am. You should arrive 15 minutes prior to your appointment time for registration.   Drink  1st bottle of contrast at 6:00am Drink 2nd bottle of contrast at 7:00 am  Have nothing to eat or drink after midnight  WARNING: IF YOU ARE ALLERGIC TO IODINE/X-RAY DYE, PLEASE NOTIFY RADIOLOGY IMMEDIATELY AT [phone]! YOU WILL BE GIVEN A 13 HOUR PREMEDICATION PREP.   If you have any questions regarding your exam or if you need to reschedule, you may call [phone] between the hours of 8:00 am and 5:00 pm, Monday-Friday.  ________________________________________________________________________

## 2019-07-13 NOTE — Progress Notes (Signed)
07/13/2019 [name] [id number] 1950-05-26   CHIEF COMPLAINT: Left lower quadrant abdominal pain  HISTORY OF PRESENT ILLNESS:  Stephen Bryant is a 69 year old male with a past medical history of hypertension, hypercholesterolemia, segmental colitis, colonic AVMs and diverticulitis.  He presents today with complaints of left lower quadrant abdominal pain which started 2 weeks ago.  He describes the pain as a dull burning discomfort which is fairly constant.  He is taking ibuprofen 200 mg 2 to 3 tablets 4 doses over the past week without significant improvement.  He is passing a normal formed brown bowel movement twice daily.  No rectal bleeding or mucus per the rectum.  When he has left lower quadrant abdominal pain he sometimes feels pressure in the rectum and has the urge to pass a bowel movement but does not.  No fever, sweats or chills.  He reports having intermittent episodes of left lower quadrant discomfort which typically resolve if he reduces his food intake and increases his water intake.  He reports having brief episodes of left lower quadrant pain once every 6 to 8 months for the past few years.  He was diagnosed with diverticulitis in 2012, resolved after taking po antibiotics.  His most recent colonoscopy was 11/15/2013 which identified 2 AVMs at the cecum and ascending colon, a 6 m sessile tubular adenomatous polyp was removed from the descending colon and moderate diverticulosis with mild segmental colitis in the descending colon and sigmoid was noted.  A repeat colonoscopy in 5 years was recommended, however, he postponed it his colonoscopy due to the Covid pandemic.  History of a tubulovillous colon polyp removed from the sigmoid colon in 2012. He wishes to proceed with a colonoscopy at this time.  Prior history of dysphagia which resolved following esophageal dilatation of the time of his EGD 05/2014.  He denies having any dysphagia, heartburn or stomach pain.  Colonoscopy  11/15/2013:  1. 2 AVMs at the cecum and in the ascending colon 2. Sessile TA polyp measuring 6 mm in the descending colon; polypectomy performed with a cold snare 3. Moderate diverticulosis with mild segment colitis in descending colon and sigmoid colon 4. Small internal hemorrhoids  EGD by Dr. Fuller Plan 05/22/2014: 1. LA Class B esophagitis 2. Stricture at the gastroesophageal junction; dilated using savary dilator over guidewire 3. Small hiatal hernia  Past Medical History:  Diagnosis Date  . AVM (arteriovenous malformation) of colon   . Colitis    sigmoid colon   . Diverticulosis of colon (without mention of hemorrhage)   . Esophageal stricture   . Essential hypertension 2018  . GERD (gastroesophageal reflux disease)   . Hiatal hernia   . Hypercholesterolemia    statin x 1 yr: tolerated med but was taken off it after 1 yr and lipids have been ok as of 2015.  Restarted atorva 06/2016.  . IFG (impaired fasting glucose)    Mild; HbA1c's consistently 5.9% or less.  . Personal history of colonic polyps 04/16/2010   tubulovillous adenomas   Past Surgical History:  Procedure Laterality Date  . colonoscopy  with polypectomy  2012; 10/2013   Tubular adenoma: recall 11/2018.  Marland Kitchen ESOPHAGOGASTRODUODENOSCOPY (EGD) WITH ESOPHAGEAL DILATION  05/2014  . VASECTOMY      reports that he quit smoking about 16 years ago. His smoking use included cigarettes. He has a 38.00 pack-year smoking history. He has never used smokeless tobacco. He reports current alcohol use of about 7.0 - 14.0 standard drinks of  alcohol per week. He reports that he does not use drugs. family history includes COPD in his brother; Diabetes in his father and maternal grandmother; Heart attack (age of onset: 24) in his father and sister; Stomach cancer in his mother. No Known Allergies    Outpatient Encounter Medications as of 07/13/2019  Medication Sig  . atorvastatin (LIPITOR) 20 MG tablet TAKE 1 TABLET BY MOUTH EVERY DAY  .  ibuprofen (ADVIL,MOTRIN) 200 MG tablet Take 200 mg by mouth as needed.   . irbesartan (AVAPRO) 75 MG tablet TAKE 1 TABLET BY MOUTH EVERY DAY  . omeprazole (PRILOSEC) 40 MG capsule TAKE 1 CAPSULE BY MOUTH EVERY DAY   No facility-administered encounter medications on file as of 07/13/2019.     REVIEW OF SYSTEMS: All other systems reviewed and negative except where noted in the History of Present Illness.   PHYSICAL EXAM: BP 136/80 (BP Location: Left Arm, Patient Position: Sitting, Cuff Size: Normal)   Pulse 88   Temp 98.5 F (36.9 C)   Ht 5' 7"  (1.702 m) Comment: height measured without shoes  Wt 187 lb (84.8 kg)   BMI 29.29 kg/m  General: Well developed  69 year old male  no acute distress. Head: Normocephalic and atraumatic. Eyes:  Sclerae non-icteric, conjunctive pink. Ears: Normal auditory acuity. Mouth: Dentition intact. No ulcers or lesions.  Neck: Supple, no lymphadenopathy or thyromegaly.  Lungs: Clear bilaterally to auscultation without wheezes, crackles or rhonchi. Heart: Regular rate and rhythm. No murmur, rub or gallop appreciated.  Abdomen: Soft, non distended. Moderate central lower abdominal and LLQ tenderness without rebound or guarding. No masses. No hepatosplenomegaly. Normoactive bowel sounds x 4 quadrants.  Rectal: Deferred.  Musculoskeletal: Symmetrical with no gross deformities. Skin: Warm and dry. No rash or lesions on visible extremities. Extremities: No edema. Neurological: Alert oriented x 4, no focal deficits.  Psychological:  Alert and cooperative. Normal mood and affect.  ASSESSMENT AND PLAN:  1. LLQ abdominal pain - CTAP with contrast rule out diverticulitis/colitis  - Eventual colonoscopy, date to be determined after CTAP results received.  - Cipro 500 mg 1 p.o. twice daily for 10 days and Flagyl 5 mg 1 p.o. twice daily  x10 days.  Patient was informed no alcohol while he is taking these antibiotics. - Push fluids, residue diet for 24 hours -  Patient to present to the ED if he develops severe abdominal pain  2.  History of colon polyps (tubular adenomatous 2015 and a tubulovillous adenomatous polyp 2012).  - See plan in # 1  3.  History of GERD with benign esophageal stricture, no current dysphagia or reflux symptoms - Continue omeprazole 40 mg once daily     CC:  McGowen, Adrian Blackwater, MD

## 2019-07-14 ENCOUNTER — Other Ambulatory Visit: Payer: Self-pay

## 2019-07-14 ENCOUNTER — Ambulatory Visit (HOSPITAL_COMMUNITY)
Admission: RE | Admit: 2019-07-14 | Discharge: 2019-07-14 | Disposition: A | Discharge status: Home / Self Care | Payer: Medicare Other | Source: Ambulatory Visit | Attending: Nurse Practitioner | Admitting: Nurse Practitioner

## 2019-07-14 DIAGNOSIS — K5792 Diverticulitis of intestine, part unspecified, without perforation or abscess without bleeding: Secondary | ICD-10-CM

## 2019-07-14 DIAGNOSIS — R1032 Left lower quadrant pain: Secondary | ICD-10-CM | POA: Diagnosis not present

## 2019-07-14 DIAGNOSIS — R109 Unspecified abdominal pain: Secondary | ICD-10-CM | POA: Diagnosis not present

## 2019-07-14 MED ORDER — IOHEXOL 300 MG/ML  SOLN
100.0000 mL | Freq: Once | INTRAMUSCULAR | Status: AC | PRN
  Administered 2019-07-14: 100 mL via INTRAVENOUS

## 2019-07-14 MED ORDER — SODIUM CHLORIDE (PF) 0.9 % IJ SOLN
INTRAMUSCULAR | Status: AC
  Filled 2019-07-14: qty 50

## 2019-07-14 NOTE — Progress Notes (Signed)
Reviewed and agree with management plan.  Pharrell Ledford T. Lajune Perine, MD FACG [facility]  

## 2019-07-21 ENCOUNTER — Telehealth: Payer: Self-pay

## 2019-07-21 NOTE — Telephone Encounter (Signed)
Spoke with this very kind patient. He reports improvement and states though improved, he is not yet at 100%. He continues to have a "burning" pain in the LLQ of the abdomen.  Pain is "less intense." Notes a lack of appetite. Denies nausea. Bowel movements are normal. Remains afebrile. Hopes he can discuss with the APP next week.

## 2019-07-21 NOTE — Telephone Encounter (Signed)
-----   Message from Noralyn Pick, NP sent at 07/14/2019  5:28 PM EDT ----- Eustaquio Maize, can you call Mr. Hellmer next week and obtain a sx update. If his sx have improved please schedule him for a colonoscopy with Dr. Fuller Plan.  Thx

## 2019-07-24 ENCOUNTER — Other Ambulatory Visit: Payer: Self-pay

## 2019-07-24 MED ORDER — DICYCLOMINE HCL 10 MG PO CAPS
10.0000 mg | ORAL_CAPSULE | Freq: Three times a day (TID) | ORAL | 1 refills | Status: DC | PRN
Start: 2019-07-24 — End: 2021-02-05

## 2019-07-24 NOTE — Telephone Encounter (Signed)
Beth, pls contact patient and schedule him for a colonoscopy with Dr. Fuller Plan. Thx

## 2019-07-24 NOTE — Telephone Encounter (Signed)
Beth, please send in RX for dicyclomine 42m one po Q 8 hours PRN abdominal pain # 30, 1 refill thx  Dr. SFuller Plan pls review CTAP. I empirically treated him with Cipro and Flagyl x 10 days, finished yesterday. He is still having a burning LLQ pain, less since taking antibiotics but not gone. Refer to last office visit note. Are you ok if he schedules a colonoscopy at this time?

## 2019-07-24 NOTE — Telephone Encounter (Signed)
CT findings reviewed. OK to proceed with colonoscopy.

## 2019-07-24 NOTE — Telephone Encounter (Signed)
Left the patient a message that the prescription has been transmitted to [facility] at [facility], [place]

## 2019-07-25 ENCOUNTER — Encounter: Payer: Self-pay | Admitting: Gastroenterology

## 2019-07-25 NOTE — Telephone Encounter (Signed)
Patient returned the call and has been scheduled

## 2019-07-25 NOTE — Telephone Encounter (Signed)
Contacted the patient and discussed a date. He will check on a care partner and call me back. Presently he is scheduled for 08/16/19 at 11:30. He will need a pre-visit scheduled if this date works for him. Awaiting his call back.

## 2019-08-03 ENCOUNTER — Ambulatory Visit (AMBULATORY_SURGERY_CENTER): Payer: Self-pay | Admitting: *Deleted

## 2019-08-03 ENCOUNTER — Other Ambulatory Visit: Payer: Self-pay

## 2019-08-03 VITALS — Ht 67.0 in | Wt 185.6 lb

## 2019-08-03 DIAGNOSIS — Z86010 Personal history of colonic polyps: Secondary | ICD-10-CM

## 2019-08-03 DIAGNOSIS — K5792 Diverticulitis of intestine, part unspecified, without perforation or abscess without bleeding: Secondary | ICD-10-CM

## 2019-08-03 DIAGNOSIS — R1032 Left lower quadrant pain: Secondary | ICD-10-CM

## 2019-08-03 MED ORDER — SUTAB 1479-225-188 MG PO TABS: 1.0000 | ORAL_TABLET | Freq: Once | ORAL | 0 refills | Status: AC

## 2019-08-03 NOTE — Progress Notes (Signed)
2nd dose of covid vaccine 05-03-19  Pt is aware that care partner will wait in the car during procedure; if they feel like they will be too hot or cold to wait in the car; they may wait in the 4 th floor lobby. Patient is aware to bring only one care partner. We want them to wear a mask (we do not have any that we can provide them), practice social distancing, and we will check their temperatures when they get here.  I did remind the patient that their care partner needs to stay in the parking lot the entire time and have a cell phone available, we will call them when the pt is ready for discharge. Patient will wear mask into building.   No trouble with anesthesia, difficulty with moving neck or hx/fam hx of malignant hyperthermia per pt   No egg or soy allergy  No home oxygen use   No medications for weight loss taken  emmi information given  Pt denies constipation issues   Sutab code put into RX and paper copy given to pt to show pharmacy

## 2019-08-16 ENCOUNTER — Ambulatory Visit (AMBULATORY_SURGERY_CENTER): Payer: Medicare Other | Admitting: Gastroenterology

## 2019-08-16 ENCOUNTER — Encounter: Payer: Self-pay | Admitting: Gastroenterology

## 2019-08-16 ENCOUNTER — Other Ambulatory Visit: Payer: Self-pay

## 2019-08-16 VITALS — BP 130/91 | HR 70 | Temp 97.5°F | Resp 15 | Ht 67.0 in | Wt 185.0 lb

## 2019-08-16 DIAGNOSIS — K5520 Angiodysplasia of colon without hemorrhage: Secondary | ICD-10-CM

## 2019-08-16 DIAGNOSIS — D123 Benign neoplasm of transverse colon: Secondary | ICD-10-CM | POA: Diagnosis not present

## 2019-08-16 DIAGNOSIS — K6389 Other specified diseases of intestine: Secondary | ICD-10-CM | POA: Diagnosis not present

## 2019-08-16 DIAGNOSIS — Z86010 Personal history of colonic polyps: Secondary | ICD-10-CM | POA: Diagnosis not present

## 2019-08-16 DIAGNOSIS — Z1211 Encounter for screening for malignant neoplasm of colon: Secondary | ICD-10-CM | POA: Diagnosis not present

## 2019-08-16 DIAGNOSIS — K5732 Diverticulitis of large intestine without perforation or abscess without bleeding: Secondary | ICD-10-CM | POA: Diagnosis not present

## 2019-08-16 DIAGNOSIS — K639 Disease of intestine, unspecified: Secondary | ICD-10-CM

## 2019-08-16 MED ORDER — SODIUM CHLORIDE 0.9 % IV SOLN
500.0000 mL | Freq: Once | INTRAVENOUS | Status: DC
Start: 2019-08-16 — End: 2019-08-16

## 2019-08-16 NOTE — Progress Notes (Signed)
VS-CW  Pt's states no medical or surgical changes since previsit or office visit.

## 2019-08-16 NOTE — Progress Notes (Signed)
Called to room to assist during endoscopic procedure.  Patient ID and intended procedure confirmed with present staff. Received instructions for my participation in the procedure from the performing physician.  

## 2019-08-16 NOTE — Patient Instructions (Signed)
Handouts provided on polyps, diverticulosis, hemorrhoids and high-fiber diet.   YOU HAD AN ENDOSCOPIC PROCEDURE TODAY AT [facility]:   Refer to the procedure report that was given to you for any specific questions about what was found during the examination.  If the procedure report does not answer your questions, please call your gastroenterologist to clarify.  If you requested that your care partner not be given the details of your procedure findings, then the procedure report has been included in a sealed envelope for you to review at your convenience later.  YOU SHOULD EXPECT: Some feelings of bloating in the abdomen. Passage of more gas than usual.  Walking can help get rid of the air that was put into your GI tract during the procedure and reduce the bloating. If you had a lower endoscopy (such as a colonoscopy or flexible sigmoidoscopy) you may notice spotting of blood in your stool or on the toilet paper. If you underwent a bowel prep for your procedure, you may not have a normal bowel movement for a few days.  Please Note:  You might notice some irritation and congestion in your nose or some drainage.  This is from the oxygen used during your procedure.  There is no need for concern and it should clear up in a day or so.  SYMPTOMS TO REPORT IMMEDIATELY:   Following lower endoscopy (colonoscopy or flexible sigmoidoscopy):  Excessive amounts of blood in the stool  Significant tenderness or worsening of abdominal pains  Swelling of the abdomen that is new, acute  Fever of 100F or higher  For urgent or emergent issues, a gastroenterologist can be reached at any hour by calling [phone]. Do not use MyChart messaging for urgent concerns.    DIET:  We do recommend a small meal at first, but then you may proceed to your regular diet.  Drink plenty of fluids but you should avoid alcoholic beverages for 24 hours.  ACTIVITY:  You should plan to take it easy for the rest  of today and you should NOT DRIVE or use heavy machinery until tomorrow (because of the sedation medicines used during the test).    FOLLOW UP: Our staff will call the number listed on your records 48-72 hours following your procedure to check on you and address any questions or concerns that you may have regarding the information given to you following your procedure. If we do not reach you, we will leave a message.  We will attempt to reach you two times.  During this call, we will ask if you have developed any symptoms of COVID 19. If you develop any symptoms (ie: fever, flu-like symptoms, shortness of breath, cough etc.) before then, please call [phone].  If you test positive for Covid 19 in the 2 weeks post procedure, please call and report this information to [place].    If any biopsies were taken you will be contacted by phone or by letter within the next 1-3 weeks.  Please call us at [phone] if you have not heard about the biopsies in 3 weeks.    SIGNATURES/CONFIDENTIALITY: You and/or your care partner have signed paperwork which will be entered into your electronic medical record.  These signatures attest to the fact that that the information above on your After Visit Summary has been reviewed and is understood.  Full responsibility of the confidentiality of this discharge information lies with you and/or your care-partner.

## 2019-08-16 NOTE — Op Note (Signed)
[place] Patient Name: Stephen Bryant Procedure Date: 08/16/2019 11:50 AM MRN: [id number] Endoscopist: Ladene Artist , MD Age: 69 Referring MD:  Date of Birth: 08/19/1950 Gender: Male Account #: [id number] Procedure:                Colonoscopy Indications:              Surveillance: Personal history of adenomatous                            polyps on last colonoscopy > 5 years ago Medicines:                Monitored Anesthesia Care Procedure:                Pre-Anesthesia Assessment:                           - Prior to the procedure, a History and Physical                            was performed, and patient medications and                            allergies were reviewed. The patient's tolerance of                            previous anesthesia was also reviewed. The risks                            and benefits of the procedure and the sedation                            options and risks were discussed with the patient.                            All questions were answered, and informed consent                            was obtained. Prior Anticoagulants: The patient has                            taken no previous anticoagulant or antiplatelet                            agents. ASA Grade Assessment: II - A patient with                            mild systemic disease. After reviewing the risks                            and benefits, the patient was deemed in                            satisfactory condition to undergo the procedure.  After obtaining informed consent, the colonoscope                            was passed under direct vision. Throughout the                            procedure, the patient's blood pressure, pulse, and                            oxygen saturations were monitored continuously. The                            Colonoscope was introduced through the anus and                            advanced to the the cecum,  identified by                            appendiceal orifice and ileocecal valve. The                            ileocecal valve, appendiceal orifice, and rectum                            were photographed. The quality of the bowel                            preparation was good. The colonoscopy was performed                            without difficulty. The patient tolerated the                            procedure well. Scope In: 11:58:21 AM Scope Out: 12:10:22 PM Scope Withdrawal Time: 0 hours 10 minutes 0 seconds  Total Procedure Duration: 0 hours 12 minutes 1 second  Findings:                 The perianal and digital rectal examinations were                            normal.                           A 8 mm polyp was found in the transverse colon. The                            polyp was sessile. The polyp was removed with a                            cold snare. Resection and retrieval were complete.                           Multiple medium-sized patchy angiodysplastic  lesions without bleeding were found in the                            ascending colon and in the cecum.                           Multiple medium-mouthed diverticula were found in                            the left colon. There was narrowing of the colon in                            association with the diverticular opening. There                            was evidence of diverticular spasm.                            Peri-diverticular erythema was seen. There was no                            evidence of diverticular bleeding. Biopsies were                            taken with a cold forceps for histology.                           Internal hemorrhoids were found during                            retroflexion. The hemorrhoids were small and Grade                            I (internal hemorrhoids that do not prolapse).                           The exam was otherwise without  abnormality on                            direct and retroflexion views. Complications:            No immediate complications. Estimated blood loss:                            None. Estimated Blood Loss:     Estimated blood loss: none. Impression:               - One 8 mm polyp in the transverse colon, removed                            with a cold snare. Resected and retrieved.                           - Multiple non-bleeding colonic angiodysplastic  lesions.                           - Moderate diverticulosis in the left colon.                            Peri-diverticular erythema was seen. Biopsied.                           - Internal hemorrhoids.                           - The examination was otherwise normal on direct                            and retroflexion views. Recommendation:           - Repeat colonoscopy after studies are complete for                            surveillance based on pathology results.                           - Patient has a contact number available for                            emergencies. The signs and symptoms of potential                            delayed complications were discussed with the                            patient. Return to normal activities tomorrow.                            Written discharge instructions were provided to the                            patient.                           - High fiber diet.                           - Continue present medications including                            dicyclomine 10 mg po tid prn abdominal pain.                           - Await pathology results. Ladene Artist, MD 08/16/2019 12:15:58 PM This report has been signed electronically.

## 2019-08-16 NOTE — Progress Notes (Signed)
To PACU, VSS. Report to [email or web address] 

## 2019-08-18 ENCOUNTER — Telehealth: Payer: Self-pay

## 2019-08-18 NOTE — Telephone Encounter (Signed)
°  Follow up Call-  Call back number 08/16/2019  Post procedure Call Back phone  # [phone]  Permission to leave phone message Yes  Some recent data might be hidden     Patient questions:  Do you have a fever, pain , or abdominal swelling? No. Pain Score  0 *  Have you tolerated food without any problems? Yes.    Have you been able to return to your normal activities? Yes.    Do you have any questions about your discharge instructions: Diet   No. Medications  No. Follow up visit  No.  Do you have questions or concerns about your Care? No.  Actions: * If pain score is 4 or above: No action needed, pain <4.  1. Have you developed a fever since your procedure? no  2.   Have you had an respiratory symptoms (SOB or cough) since your procedure? no  3.   Have you tested positive for COVID 19 since your procedure no  4.   Have you had any family members/close contacts diagnosed with the COVID 19 since your procedure?  no   If yes to any of these questions please route to Joylene John, RN and Erenest Rasher, RN

## 2019-08-18 NOTE — Telephone Encounter (Signed)
Left message on answering machine. 

## 2019-08-21 ENCOUNTER — Encounter: Payer: Self-pay | Admitting: Gastroenterology

## 2019-11-04 ENCOUNTER — Other Ambulatory Visit: Payer: Self-pay | Admitting: Family Medicine

## 2019-12-26 DIAGNOSIS — Z23 Encounter for immunization: Secondary | ICD-10-CM | POA: Diagnosis not present

## 2020-02-04 ENCOUNTER — Other Ambulatory Visit: Payer: Self-pay | Admitting: Family Medicine

## 2020-02-05 NOTE — Telephone Encounter (Signed)
LVM for pt to CB. He is currently due for appt. Last seen 02/08/19 and advised to f/u 6 months. He was given 90 d supply for all medications being requested but can only be given 30 d supply until appt is made. If pt calls back, please assist with scheduling.

## 2020-02-07 ENCOUNTER — Telehealth: Payer: Self-pay | Admitting: Family Medicine

## 2020-02-07 ENCOUNTER — Other Ambulatory Visit: Payer: Self-pay

## 2020-02-07 NOTE — Telephone Encounter (Signed)
Rx sent 

## 2020-02-07 NOTE — Telephone Encounter (Signed)
Patient states he will run out of Irbesartan, omeprazole and atorvastatin in 2 days. He has scheduled appt 02/22/20 and would like refill enough to get through.

## 2020-02-21 ENCOUNTER — Other Ambulatory Visit: Payer: Self-pay

## 2020-02-22 ENCOUNTER — Encounter: Payer: Self-pay | Admitting: Family Medicine

## 2020-02-22 ENCOUNTER — Ambulatory Visit (INDEPENDENT_AMBULATORY_CARE_PROVIDER_SITE_OTHER): Payer: Medicare Other | Admitting: Family Medicine

## 2020-02-22 VITALS — BP 123/79 | HR 86 | Temp 97.8°F | Resp 16 | Ht 67.0 in | Wt 190.6 lb

## 2020-02-22 DIAGNOSIS — Z0000 Encounter for general adult medical examination without abnormal findings: Secondary | ICD-10-CM | POA: Diagnosis not present

## 2020-02-22 DIAGNOSIS — R7303 Prediabetes: Secondary | ICD-10-CM

## 2020-02-22 DIAGNOSIS — I10 Essential (primary) hypertension: Secondary | ICD-10-CM

## 2020-02-22 DIAGNOSIS — Z125 Encounter for screening for malignant neoplasm of prostate: Secondary | ICD-10-CM | POA: Diagnosis not present

## 2020-02-22 DIAGNOSIS — E7800 Pure hypercholesterolemia, unspecified: Secondary | ICD-10-CM

## 2020-02-22 LAB — COMPREHENSIVE METABOLIC PANEL
ALT: 27 U/L (ref 0–53)
AST: 21 U/L (ref 0–37)
Albumin: 4.4 g/dL (ref 3.5–5.2)
Alkaline Phosphatase: 64 U/L (ref 39–117)
BUN: 20 mg/dL (ref 6–23)
CO2: 29 mEq/L (ref 19–32)
Calcium: 9.5 mg/dL (ref 8.4–10.5)
Chloride: 104 mEq/L (ref 96–112)
Creatinine, Ser: 1.04 mg/dL (ref 0.40–1.50)
GFR: 72.71 mL/min (ref >60.00)
Glucose, Bld: 97 mg/dL (ref 70–99)
Potassium: 4.7 mEq/L (ref 3.5–5.1)
Sodium: 139 mEq/L (ref 135–145)
Total Bilirubin: 0.7 mg/dL (ref 0.2–1.2)
Total Protein: 7.2 g/dL (ref 6.0–8.3)

## 2020-02-22 LAB — CBC WITH DIFFERENTIAL/PLATELET
Basophils Absolute: 0.1 10*3/uL (ref 0.0–0.1)
Basophils Relative: 0.7 % (ref 0.0–3.0)
Eosinophils Absolute: 0.3 10*3/uL (ref 0.0–0.7)
Eosinophils Relative: 3.9 % (ref 0.0–5.0)
HCT: 43.6 % (ref 39.0–52.0)
Hemoglobin: 14.5 g/dL (ref 13.0–17.0)
Lymphocytes Relative: 40.4 % (ref 12.0–46.0)
Lymphs Abs: 3.3 10*3/uL (ref 0.7–4.0)
MCHC: 33.2 g/dL (ref 30.0–36.0)
MCV: 89.9 fl (ref 78.0–100.0)
Monocytes Absolute: 1.1 10*3/uL — ABNORMAL HIGH (ref 0.1–1.0)
Monocytes Relative: 13.2 % — ABNORMAL HIGH (ref 3.0–12.0)
Neutro Abs: 3.4 10*3/uL (ref 1.4–7.7)
Neutrophils Relative %: 41.8 % — ABNORMAL LOW (ref 43.0–77.0)
Platelets: 323 10*3/uL (ref 150.0–400.0)
RBC: 4.85 Mil/uL (ref 4.22–5.81)
RDW: 14.1 % (ref 11.5–15.5)
WBC: 8.1 10*3/uL (ref 4.0–10.5)

## 2020-02-22 LAB — LIPID PANEL
Cholesterol: 189 mg/dL (ref 0–200)
HDL: 70.1 mg/dL (ref >39.00)
LDL Cholesterol: 101 mg/dL — ABNORMAL HIGH (ref 0–99)
NonHDL: 119.17
Total CHOL/HDL Ratio: 3
Triglycerides: 91 mg/dL (ref 0.0–149.0)
VLDL: 18.2 mg/dL (ref 0.0–40.0)

## 2020-02-22 LAB — HEMOGLOBIN A1C: Hgb A1c MFr Bld: 5.9 % (ref 4.6–6.5)

## 2020-02-22 LAB — PSA, MEDICARE: PSA: 1.49 ng/ml (ref 0.10–4.00)

## 2020-02-22 NOTE — Patient Instructions (Signed)

## 2020-02-22 NOTE — Progress Notes (Signed)
Office Note 02/22/2020  CC:  Chief Complaint  Patient presents with  . Follow-up    RCI, pt is fasting    HPI:  Stephen Bryant is a 69 y.o. White male who is here for annual health maintenance exam plus f/u HTN, HLD, and prediabetes. I last saw Stephen Bryant 1 yr ago. A/P at that time: A/P as of last visit: "1) HTN: The current medical regimen is effective;  continue present plan and medications. Lytes/cr today.  2) HLD: tolerating statin. FLP and hepatic panel today.  3) GERD: asymptomatic as long as he's taking daily PPI and adjusting diet.  4) Prediabetes: definitely needs to increase efforts at TLC. Fasting glucose and HbA1c today.  5) Preventative health care: prostate ca screening-->he did want PSA toda but he declined DRE today. Vaccines all UTD except shingrix, which we discussed today and he wants to defer for now.  Prev health care: flu vaccine UTD.  He deferred shingrix for now. Due for repeat colonoscopy->he will consider timing of repeat given covid circumstances."  INTERIM HX: Doing well, no acute complaints.  HTN: occ home bp is <130/80. Compliant with irbesart 75 qd.  HLD: tolerating atorva 70m qd.  No formal exercise but pretty active.  Diet is fairly good. Helping out a lot with young grandkids.  Past Medical History:  Diagnosis Date  . AVM (arteriovenous malformation) of colon   . Diverticulosis of colon (without mention of hemorrhage)    with recurrent diverticulitis  . Esophageal stricture   . Essential hypertension 2018  . GERD (gastroesophageal reflux disease)   . Hiatal hernia   . Hypercholesterolemia    statin x 1 yr: tolerated med but was taken off it after 1 yr and lipids have been ok as of 2015.  Restarted atorva 06/2016.  . IFG (impaired fasting glucose)    Mild; HbA1c's consistently 5.9% or less.  . Personal history of colonic polyps 04/16/2010; 08/16/19   Recall 2028  [place]     Past  Surgical History:  Procedure Laterality Date  . COLONOSCOPY  2012; 08/16/19   +adenomas->recall 2028.  . colonoscopy  with polypectomy  2012; 10/2013   Tubular adenoma: recall 11/2018.  .Marland KitchenESOPHAGOGASTRODUODENOSCOPY (EGD) WITH ESOPHAGEAL DILATION  05/2014  . UPPER GASTROINTESTINAL ENDOSCOPY    . VASECTOMY      Family History  Problem Relation Age of Onset  . Stomach cancer Mother   . COPD Brother   . Heart attack Father 681 . Diabetes Father   . Diabetes Maternal Grandmother   . Heart attack Sister 618 . Colon cancer Neg Hx   . Stroke Neg Hx   . Asthma Neg Hx   . Esophageal cancer Neg Hx   . Rectal cancer Neg Hx     Social History   Socioeconomic History  . Marital status: Married    Spouse name: Not on file  . Number of children: 2  . Years of education: Not on file  . Highest education level: Not on file  Occupational History  . Occupation: service tech/retired    Employer: [employer]  Tobacco Use  . Smoking status: Former Smoker    Packs/day: 1.00    Years: 38.00    Pack years: 38.00    Types: Cigarettes    Quit date: 03/03/2003    Years since quitting: 16.9  . Smokeless tobacco: Never Used  . Tobacco comment: smoked age [age] up to 1  ppd  Vaping Use  . Vaping Use: Never used  Substance and Sexual Activity  . Alcohol use: Yes    Alcohol/week: 7.0 - 14.0 standard drinks    Types: 7 - 14 Standard drinks or equivalent per week    Comment: 2 glasses of wine everyday or every other day  . Drug use: No  . Sexual activity: Not on file  Other Topics Concern  . Not on file  Social History Narrative   Married, two daughters married.   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   Alc: 1-2 glasses of wine most days.   Tob: quit 2005.  40+  pack-yr hx.   Social Determinants of Health   Financial Resource Strain: Not on file  Food Insecurity: Not on file  Transportation Needs: Not on file  Physical Activity: Not on file  Stress: Not on file   Social Connections: Not on file  Intimate Partner Violence: Not on file    Outpatient Medications Prior to Visit  Medication Sig Dispense Refill  . atorvastatin (LIPITOR) 20 MG tablet TAKE 1 TABLET BY MOUTH EVERY DAY 30 tablet 0  . ibuprofen (ADVIL,MOTRIN) 200 MG tablet Take 200 mg by mouth as needed.     . irbesartan (AVAPRO) 75 MG tablet TAKE 1 TABLET BY MOUTH EVERY DAY 30 tablet 0  . omeprazole (PRILOSEC) 40 MG capsule TAKE 1 CAPSULE BY MOUTH EVERY DAY 30 capsule 0  . dicyclomine (BENTYL) 10 MG capsule Take 1 capsule (10 mg total) by mouth every 8 (eight) hours as needed for spasms. (Patient not taking: No sig reported) 30 capsule 1   No facility-administered medications prior to visit.    No Known Allergies  ROS Review of Systems  Constitutional: Negative for appetite change, chills, fatigue and fever.  HENT: Negative for congestion, dental problem, ear pain and sore throat.   Eyes: Negative for discharge, redness and visual disturbance.  Respiratory: Negative for cough, chest tightness, shortness of breath and wheezing.   Cardiovascular: Negative for chest pain, palpitations and leg swelling.  Gastrointestinal: Negative for abdominal pain, blood in stool, diarrhea, nausea and vomiting.  Genitourinary: Negative for difficulty urinating, dysuria, flank pain, frequency, hematuria and urgency.  Musculoskeletal: Negative for arthralgias, back pain, joint swelling, myalgias and neck stiffness.  Skin: Negative for pallor and rash.  Neurological: Negative for dizziness, speech difficulty, weakness and headaches.  Hematological: Negative for adenopathy. Does not bruise/bleed easily.  Psychiatric/Behavioral: Negative for confusion and sleep disturbance. The patient is not nervous/anxious.     PE; Vitals with BMI 02/22/2020 08/16/2019 08/16/2019  Height 5' 7"  - -  Weight 190 lbs 10 oz - -  BMI 29.79 - -  Systolic 892 119 417  Diastolic 79 91 59  Pulse 86 70 68     Gen: Alert,  well appearing.  Patient is oriented to person, place, time, and situation. AFFECT: pleasant, lucid thought and speech. ENT: Ears: EACs clear, normal epithelium.  TMs with good light reflex and landmarks bilaterally.  Eyes: no injection, icteris, swelling, or exudate.  EOMI, PERRLA. Nose: no drainage or turbinate edema/swelling.  No injection or focal lesion.  Mouth: lips without lesion/swelling.  Oral mucosa pink and moist.  Dentition intact and without obvious caries or gingival swelling.  Oropharynx without erythema, exudate, or swelling.  Neck: supple/nontender.  No LAD, mass, or TM.  Carotid pulses 2+ bilaterally, without bruits. CV: RRR, no m/r/g.   LUNGS: CTA bilat, nonlabored resps, good aeration in all  lung fields. ABD: soft, NT, ND, BS normal.  No hepatospenomegaly or mass.  No bruits. EXT: no clubbing, cyanosis, or edema.  Musculoskeletal: no joint swelling, erythema, warmth, or tenderness.  ROM of all joints intact. Skin - no sores or suspicious lesions or rashes or color changes   Pertinent labs:  Lab Results  Component Value Date   TSH 2.18 07/01/2016   Lab Results  Component Value Date   WBC 8.6 07/13/2019   HGB 14.1 07/13/2019   HCT 41.9 07/13/2019   MCV 89.6 07/13/2019   PLT 299.0 07/13/2019   Lab Results  Component Value Date   CREATININE 0.93 07/13/2019   BUN 14 07/13/2019   NA 140 07/13/2019   K 3.8 07/13/2019   CL 104 07/13/2019   CO2 29 07/13/2019   Lab Results  Component Value Date   ALT 23 07/13/2019   AST 22 07/13/2019   ALKPHOS 72 07/13/2019   BILITOT 0.5 07/13/2019   Lab Results  Component Value Date   CHOL 194 02/08/2019   Lab Results  Component Value Date   HDL 62.80 02/08/2019   Lab Results  Component Value Date   LDLCALC 101 (H) 02/08/2019   Lab Results  Component Value Date   TRIG 151.0 (H) 02/08/2019   Lab Results  Component Value Date   CHOLHDL 3 02/08/2019   Lab Results  Component Value Date   PSA 1.45 02/08/2019    PSA 0.99 11/04/2017   PSA 1.02 07/01/2016   Lab Results  Component Value Date   HGBA1C 5.9 02/08/2019   ASSESSMENT AND PLAN:   1) HTN: good control on irbesartan 75 qd. Lytes/cr today.  2) HLD: tolerating atorva 10m qd. FLP and hepatic panel.  3) Prediabetes Fasting gluc and Hba1c today.  4) Health maintenance exam: Reviewed age and gender appropriate health maintenance issues (prudent diet, regular exercise, health risks of tobacco and excessive alcohol, use of seatbelts, fire alarms in home, use of sunscreen).  Also reviewed age and gender appropriate health screening as well as vaccine recommendations. Vaccines: Shingrix discussed->he deferred today.  Otherwise ALL UTD. Labs: fasting HP + Hba1c and PSA. Prostate ca screening: PSA. Colon ca screening: Tubular adenoma on 08/16/19 colonoscopy->recall 08/2026.  An After Visit Summary was printed and given to the patient.  FOLLOW UP:  Return in about 6 months (around 08/22/2020) for routine chronic illness f/u.  Signed:  PCrissie Sickles MD           02/22/2020

## 2020-03-02 ENCOUNTER — Other Ambulatory Visit: Payer: Self-pay | Admitting: Family Medicine

## 2020-08-22 ENCOUNTER — Ambulatory Visit: Payer: Medicare Other | Admitting: Family Medicine

## 2020-08-29 ENCOUNTER — Other Ambulatory Visit: Payer: Self-pay | Admitting: Family Medicine

## 2020-11-13 ENCOUNTER — Other Ambulatory Visit: Payer: Self-pay

## 2020-11-13 ENCOUNTER — Encounter: Payer: Self-pay | Admitting: Family Medicine

## 2020-11-13 ENCOUNTER — Ambulatory Visit (INDEPENDENT_AMBULATORY_CARE_PROVIDER_SITE_OTHER): Payer: Medicare Other | Admitting: Family Medicine

## 2020-11-13 VITALS — BP 130/78 | HR 88 | Temp 97.8°F | Ht 67.0 in | Wt 192.0 lb

## 2020-11-13 DIAGNOSIS — J069 Acute upper respiratory infection, unspecified: Secondary | ICD-10-CM

## 2020-11-13 DIAGNOSIS — R059 Cough, unspecified: Secondary | ICD-10-CM

## 2020-11-13 NOTE — Progress Notes (Signed)
OFFICE VISIT  11/13/2020  CC:  Chief Complaint  Patient presents with   Sore Throat    Occasionally blood noticed when blowing nose; mainly concerned regarding pressure in chest. Granddaughter is less than 1 year and had RSV.   HPI:    Patient is a 70 y.o. Caucasian male who presents for recent cough. Onset about  1 week ago with mild ST and cough that is mainly worse at night.  Not much nasal cong/sneezing.  No HA. No SOB or wheezing or chest pain.  No fevers. He spent several days caring for his grandbaby with RSV prior to onset of his sx's. Trying robitussin dm and it helps.  Past Medical History:  Diagnosis Date   AVM (arteriovenous malformation) of colon    Diverticulosis of colon (without mention of hemorrhage)    with recurrent diverticulitis   Esophageal stricture    Essential hypertension 2018   GERD (gastroesophageal reflux disease)    Hiatal hernia    Hypercholesterolemia    statin x 1 yr: tolerated med but was taken off it after 1 yr and lipids have been ok as of 2015.  Restarted atorva 06/2016.   IFG (impaired fasting glucose)    Mild; HbA1c's consistently 5.9% or less.   Personal history of colonic polyps 04/16/2010; 08/16/19   Recall 2028   UC (ulcerative colitis) [facility])    sigmoid colon     Past Surgical History:  Procedure Laterality Date   COLONOSCOPY  2012; 08/16/19   +adenomas->recall 2028.   colonoscopy  with polypectomy  2012; 10/2013   Tubular adenoma: recall 11/2018.   ESOPHAGOGASTRODUODENOSCOPY (EGD) WITH ESOPHAGEAL DILATION  05/2014   UPPER GASTROINTESTINAL ENDOSCOPY     VASECTOMY      Outpatient Medications Prior to Visit  Medication Sig Dispense Refill   atorvastatin (LIPITOR) 20 MG tablet TAKE 1 TABLET BY MOUTH EVERY DAY 90 tablet 1   ibuprofen (ADVIL,MOTRIN) 200 MG tablet Take 200 mg by mouth as needed.      irbesartan (AVAPRO) 75 MG tablet TAKE 1 TABLET BY MOUTH EVERY DAY 90 tablet 1   omeprazole (PRILOSEC) 40 MG capsule TAKE 1 CAPSULE BY  MOUTH EVERY DAY 90 capsule 1   dicyclomine (BENTYL) 10 MG capsule Take 1 capsule (10 mg total) by mouth every 8 (eight) hours as needed for spasms. (Patient not taking: No sig reported) 30 capsule 1   No facility-administered medications prior to visit.    No Known Allergies  ROS As per HPI  PE: Vitals with BMI 11/13/2020 02/22/2020 08/16/2019  Height 5' 7"  5' 7"  -  Weight 192 lbs 190 lbs 10 oz -  BMI 79.89 21.19 -  Systolic 417 408 144  Diastolic 78 79 91  Pulse 88 86 70     VS: noted--normal. Gen: alert, NAD, NONTOXIC APPEARING. HEENT: eyes without injection, drainage, or swelling.  Ears: EACs clear, TMs with normal light reflex and landmarks.  Nose: no congestion or swelling.  No purulent d/c.  No paranasal sinus TTP.  No facial swelling.  Throat and mouth without focal lesion.  No pharyngial swelling, erythema, or exudate.   Neck: supple, no LAD.   LUNGS: CTA bilat, nonlabored resps.   CV: RRR, no m/r/g. EXT: no c/c/e SKIN: no rash   LABS:    Chemistry      Component Value Date/Time   NA 139 02/22/2020 1045   K 4.7 02/22/2020 1045   CL 104 02/22/2020 1045   CO2 29 02/22/2020 1045  BUN 20 02/22/2020 1045   CREATININE 1.04 02/22/2020 1045   CREATININE 1.01 08/07/2016 1546      Component Value Date/Time   CALCIUM 9.5 02/22/2020 1045   ALKPHOS 64 02/22/2020 1045   AST 21 02/22/2020 1045   ALT 27 02/22/2020 1045   BILITOT 0.7 02/22/2020 1045     IMPRESSION AND PLAN:  Viral URI with cough, +exposure to RSV. Reassured, symptomatic care with mucinex dm recommended.  An After Visit Summary was printed and given to the patient.  FOLLOW UP: No follow-ups on file.  Signed:  Crissie Sickles, MD           11/13/2020

## 2020-12-20 DIAGNOSIS — U071 COVID-19: Secondary | ICD-10-CM | POA: Diagnosis not present

## 2020-12-20 DIAGNOSIS — Z23 Encounter for immunization: Secondary | ICD-10-CM | POA: Diagnosis not present

## 2021-01-27 ENCOUNTER — Other Ambulatory Visit: Payer: Self-pay

## 2021-01-27 ENCOUNTER — Ambulatory Visit (INDEPENDENT_AMBULATORY_CARE_PROVIDER_SITE_OTHER): Payer: Medicare Other | Admitting: Family Medicine

## 2021-01-27 ENCOUNTER — Ambulatory Visit (HOSPITAL_BASED_OUTPATIENT_CLINIC_OR_DEPARTMENT_OTHER)
Admission: RE | Admit: 2021-01-27 | Discharge: 2021-01-27 | Disposition: A | Discharge status: Home / Self Care | Payer: Medicare Other | Source: Ambulatory Visit | Attending: Family Medicine | Admitting: Family Medicine

## 2021-01-27 ENCOUNTER — Encounter: Payer: Self-pay | Admitting: Family Medicine

## 2021-01-27 VITALS — BP 144/83 | HR 106 | Temp 100.0°F | Ht 67.0 in | Wt 192.6 lb

## 2021-01-27 DIAGNOSIS — R059 Cough, unspecified: Secondary | ICD-10-CM | POA: Diagnosis not present

## 2021-01-27 DIAGNOSIS — R5081 Fever presenting with conditions classified elsewhere: Secondary | ICD-10-CM | POA: Diagnosis not present

## 2021-01-27 DIAGNOSIS — J111 Influenza due to unidentified influenza virus with other respiratory manifestations: Secondary | ICD-10-CM

## 2021-01-27 DIAGNOSIS — R051 Acute cough: Secondary | ICD-10-CM | POA: Diagnosis not present

## 2021-01-27 DIAGNOSIS — Z8616 Personal history of COVID-19: Secondary | ICD-10-CM | POA: Diagnosis not present

## 2021-01-27 DIAGNOSIS — R509 Fever, unspecified: Secondary | ICD-10-CM | POA: Diagnosis not present

## 2021-01-27 MED ORDER — OSELTAMIVIR PHOSPHATE 75 MG PO CAPS: ORAL_CAPSULE | ORAL | 0 refills | Status: DC

## 2021-01-27 MED ORDER — HYDROCODONE BIT-HOMATROP MBR 5-1.5 MG/5ML PO SOLN: ORAL | 0 refills | Status: DC

## 2021-01-27 NOTE — Progress Notes (Signed)
OFFICE VISIT  01/27/2021  CC:  Chief Complaint  Patient presents with   Cough    Pt c/o dry cough, chills w/ some congestion x4 days. Neg covid test 11/24.   HPI:    Patient is a 70 y.o. male who presents for cough. He had covid POS illness 01/10/21 (dry cough, HA, nasal congestion, body aches).  Home covid test NEG 01/23/21. These sx's improved some over the next 10d or so, then a few days ago started feeling bad again, has had some low grade fevers, bad nasal congestion, needs to breath through mouth and sleep sitting up. No SOB or chest pain.  Body aches have returned.  Mucinex DM tried but no help.  He got flu vaccine about 5 wks ago. Has had all covid vaccines.  ROS as above, plus--> no wheezing, no cough, no dizziness, no HAs, no rashes, no melena/hematochezia.  No polyuria or polydipsia.  No focal weakness, paresthesias, or tremors.  No acute vision or hearing abnormalities.  No dysuria or unusual/new urinary urgency or frequency.  No recent changes in lower legs. No n/v/d or abd pain.  No palpitations.     Past Medical History:  Diagnosis Date   AVM (arteriovenous malformation) of colon    Diverticulosis of colon (without mention of hemorrhage)    with recurrent diverticulitis   Esophageal stricture    Essential hypertension 2018   GERD (gastroesophageal reflux disease)    Hiatal hernia    Hypercholesterolemia    statin x 1 yr: tolerated med but was taken off it after 1 yr and lipids have been ok as of 2015.  Restarted atorva 06/2016.   IFG (impaired fasting glucose)    Mild; HbA1c's consistently 5.9% or less.   Personal history of colonic polyps 04/16/2010; 08/16/19   Recall 2028   UC (ulcerative colitis) [facility])    sigmoid colon     Past Surgical History:  Procedure Laterality Date   COLONOSCOPY  2012; 08/16/19   +adenomas->recall 2028.   colonoscopy  with polypectomy  2012; 10/2013   Tubular adenoma: recall 11/2018.   ESOPHAGOGASTRODUODENOSCOPY (EGD) WITH  ESOPHAGEAL DILATION  05/2014   UPPER GASTROINTESTINAL ENDOSCOPY     VASECTOMY      Outpatient Medications Prior to Visit  Medication Sig Dispense Refill   atorvastatin (LIPITOR) 20 MG tablet TAKE 1 TABLET BY MOUTH EVERY DAY 90 tablet 1   dicyclomine (BENTYL) 10 MG capsule Take 1 capsule (10 mg total) by mouth every 8 (eight) hours as needed for spasms. (Patient not taking: No sig reported) 30 capsule 1   ibuprofen (ADVIL,MOTRIN) 200 MG tablet Take 200 mg by mouth as needed.      irbesartan (AVAPRO) 75 MG tablet TAKE 1 TABLET BY MOUTH EVERY DAY 90 tablet 1   omeprazole (PRILOSEC) 40 MG capsule TAKE 1 CAPSULE BY MOUTH EVERY DAY 90 capsule 1   No facility-administered medications prior to visit.    No Known Allergies  ROS As per HPI  PE: Vitals with BMI 01/27/2021 11/13/2020 02/22/2020  Height 5' 7"  5' 7"  5' 7"   Weight 192 lbs 10 oz 192 lbs 190 lbs 10 oz  BMI 30.16 41.32 44.01  Systolic 027 253 664  Diastolic 83 78 79  Pulse 403 88 86     VS: noted--febrile to 101.8 Gen: alert, NAD, NONTOXIC APPEARING. HEENT: eyes without injection, drainage, or swelling.  Ears: EACs clear, TMs with normal light reflex and landmarks.  Nose: Clear rhinorrhea, with some dried, crusty exudate  adherent to mildly injected mucosa.  No purulent d/c.  No paranasal sinus TTP.  No facial swelling.  Throat and mouth without focal lesion.  No pharyngial swelling, erythema, or exudate.   Neck: supple, no LAD.   LUNGS: CTA bilat, nonlabored resps.   CV: RRR, no m/r/g. EXT: no c/c/e SKIN: no rash   LABS:  Lab Results  Component Value Date   WBC 8.1 02/22/2020   HGB 14.5 02/22/2020   HCT 43.6 02/22/2020   MCV 89.9 02/22/2020   PLT 323.0 02/22/2020     Chemistry      Component Value Date/Time   NA 139 02/22/2020 1045   K 4.7 02/22/2020 1045   CL 104 02/22/2020 1045   CO2 29 02/22/2020 1045   BUN 20 02/22/2020 1045   CREATININE 1.04 02/22/2020 1045   CREATININE 1.01 08/07/2016 1546       Component Value Date/Time   CALCIUM 9.5 02/22/2020 1045   ALKPHOS 64 02/22/2020 1045   AST 21 02/22/2020 1045   ALT 27 02/22/2020 1045   BILITOT 0.7 02/22/2020 1045     IMPRESSION AND PLAN:  Influenza-like illness.  Recent history of COVID-19 illness.  Of note in September he also had RSV. We do not have flu testing in the office today. I will treat empirically with Tamiflu 75 mg twice a day for 5 days.  Additionally will check chest x-ray to see if any subtle indications of bacterial pneumonia. Hycodan suspension for cough, 1 to 2 teaspoons 3 times a day as needed. Allegra 180 daily as needed.  Continue saline nasal spray.  Importance of rest and hydration emphasized.  An After Visit Summary was printed and given to the patient.  FOLLOW UP: Return if symptoms worsen or fail to improve.  Signed:  Crissie Sickles, MD           01/27/2021

## 2021-02-05 ENCOUNTER — Other Ambulatory Visit: Payer: Self-pay

## 2021-02-05 ENCOUNTER — Ambulatory Visit (INDEPENDENT_AMBULATORY_CARE_PROVIDER_SITE_OTHER): Payer: Medicare Other | Admitting: Family Medicine

## 2021-02-05 ENCOUNTER — Encounter: Payer: Self-pay | Admitting: Family Medicine

## 2021-02-05 VITALS — BP 112/71 | HR 84 | Temp 98.2°F | Ht 67.0 in | Wt 188.0 lb

## 2021-02-05 DIAGNOSIS — R7303 Prediabetes: Secondary | ICD-10-CM

## 2021-02-05 DIAGNOSIS — I10 Essential (primary) hypertension: Secondary | ICD-10-CM

## 2021-02-05 DIAGNOSIS — Z125 Encounter for screening for malignant neoplasm of prostate: Secondary | ICD-10-CM | POA: Diagnosis not present

## 2021-02-05 DIAGNOSIS — E7800 Pure hypercholesterolemia, unspecified: Secondary | ICD-10-CM

## 2021-02-05 LAB — COMPREHENSIVE METABOLIC PANEL
ALT: 26 U/L (ref 0–53)
AST: 22 U/L (ref 0–37)
Albumin: 4.3 g/dL (ref 3.5–5.2)
Alkaline Phosphatase: 76 U/L (ref 39–117)
BUN: 13 mg/dL (ref 6–23)
CO2: 29 mEq/L (ref 19–32)
Calcium: 9.5 mg/dL (ref 8.4–10.5)
Chloride: 103 mEq/L (ref 96–112)
Creatinine, Ser: 0.99 mg/dL (ref 0.40–1.50)
GFR: 76.62 mL/min (ref >60.00)
Glucose, Bld: 100 mg/dL — ABNORMAL HIGH (ref 70–99)
Potassium: 4.3 mEq/L (ref 3.5–5.1)
Sodium: 139 mEq/L (ref 135–145)
Total Bilirubin: 1 mg/dL (ref 0.2–1.2)
Total Protein: 7.4 g/dL (ref 6.0–8.3)

## 2021-02-05 LAB — CBC WITH DIFFERENTIAL/PLATELET
Basophils Absolute: 0 10*3/uL (ref 0.0–0.1)
Basophils Relative: 0.7 % (ref 0.0–3.0)
Eosinophils Absolute: 0.2 10*3/uL (ref 0.0–0.7)
Eosinophils Relative: 3.1 % (ref 0.0–5.0)
HCT: 43.5 % (ref 39.0–52.0)
Hemoglobin: 14.1 g/dL (ref 13.0–17.0)
Lymphocytes Relative: 38.6 % (ref 12.0–46.0)
Lymphs Abs: 2.6 10*3/uL (ref 0.7–4.0)
MCHC: 32.4 g/dL (ref 30.0–36.0)
MCV: 89.3 fl (ref 78.0–100.0)
Monocytes Absolute: 0.8 10*3/uL (ref 0.1–1.0)
Monocytes Relative: 12.3 % — ABNORMAL HIGH (ref 3.0–12.0)
Neutro Abs: 3.1 10*3/uL (ref 1.4–7.7)
Neutrophils Relative %: 45.3 % (ref 43.0–77.0)
Platelets: 342 10*3/uL (ref 150.0–400.0)
RBC: 4.87 Mil/uL (ref 4.22–5.81)
RDW: 13.6 % (ref 11.5–15.5)
WBC: 6.8 10*3/uL (ref 4.0–10.5)

## 2021-02-05 LAB — LIPID PANEL
Cholesterol: 172 mg/dL (ref 0–200)
HDL: 52.4 mg/dL (ref >39.00)
LDL Cholesterol: 95 mg/dL (ref 0–99)
NonHDL: 119.47
Total CHOL/HDL Ratio: 3
Triglycerides: 120 mg/dL (ref 0.0–149.0)
VLDL: 24 mg/dL (ref 0.0–40.0)

## 2021-02-05 LAB — PSA, MEDICARE: PSA: 1.96 ng/ml (ref 0.10–4.00)

## 2021-02-05 LAB — HEMOGLOBIN A1C: Hgb A1c MFr Bld: 6.4 % (ref 4.6–6.5)

## 2021-02-05 MED ORDER — IRBESARTAN 75 MG PO TABS: 75.0000 mg | ORAL_TABLET | Freq: Every day | ORAL | 3 refills | Status: DC

## 2021-02-05 MED ORDER — OMEPRAZOLE 40 MG PO CPDR: DELAYED_RELEASE_CAPSULE | ORAL | 3 refills | Status: DC

## 2021-02-05 MED ORDER — ATORVASTATIN CALCIUM 20 MG PO TABS: 20.0000 mg | ORAL_TABLET | Freq: Every day | ORAL | 3 refills | Status: DC

## 2021-02-05 NOTE — Progress Notes (Signed)
Office Note 02/05/2021  CC:  Chief Complaint  Patient presents with   Follow-up    RCI; pt is fasting   HPI:  Patient is a 70 y.o. male who is here for f/u HTN, HLD, prediabetes. I last saw him 1 yr ago. A/P as of that visit: "1) HTN: good control on irbesartan 75 qd. Lytes/cr today.   2) HLD: tolerating atorva 58m qd. FLP and hepatic panel.   3) Prediabetes Fasting gluc and Hba1c today.   4) Health maintenance exam: Reviewed age and gender appropriate health maintenance issues (prudent diet, regular exercise, health risks of tobacco and excessive alcohol, use of seatbelts, fire alarms in home, use of sunscreen).  Also reviewed age and gender appropriate health screening as well as vaccine recommendations. Vaccines: Shingrix discussed->he deferred today.  Otherwise ALL UTD. Labs: fasting HP + Hba1c and PSA. Prostate ca screening: PSA. Colon ca screening: Tubular adenoma on 08/16/19 colonoscopy->recall 08/2026."  INTERIM HX: Greene is feeling well.  He is completely over his recent influenza-like illness.  He is maintaining a good diet and active lifestyle. He is compliant with his blood pressure and cholesterol medicines daily.   Occ home bp check is done--consistently normal.  ROS as above, plus--> no fevers, no CP, no SOB, no wheezing, no cough, no dizziness, no HAs, no rashes, no melena/hematochezia.  No polyuria or polydipsia.  No myalgias or arthralgias.  No focal weakness, paresthesias, or tremors.  No acute vision or hearing abnormalities.  No dysuria or unusual/new urinary urgency or frequency.  No recent changes in lower legs. No n/v/d or abd pain.  No palpitations.     Past Medical History:  Diagnosis Date   AVM (arteriovenous malformation) of colon    Diverticulosis of colon (without mention of hemorrhage)    with recurrent diverticulitis   Esophageal stricture    Essential hypertension 2018   GERD (gastroesophageal reflux disease)    Hiatal hernia     Hypercholesterolemia    statin x 1 yr: tolerated med but was taken off it after 1 yr and lipids have been ok as of 2015.  Restarted atorva 06/2016.   IFG (impaired fasting glucose)    Mild; HbA1c's consistently 5.9% or less.   Personal history of colonic polyps 04/16/2010; 08/16/19   Recall 2028   UC (ulcerative colitis) ([facility]    sigmoid colon     Past Surgical History:  Procedure Laterality Date   COLONOSCOPY  2012; 08/16/19   +adenomas->recall 2028.   colonoscopy  with polypectomy  2012; 10/2013   Tubular adenoma: recall 11/2018.   ESOPHAGOGASTRODUODENOSCOPY (EGD) WITH ESOPHAGEAL DILATION  05/2014   UPPER GASTROINTESTINAL ENDOSCOPY     VASECTOMY      Family History  Problem Relation Age of Onset   Stomach cancer Mother    COPD Brother    Heart attack Father 655  Diabetes Father    Diabetes Maternal Grandmother    Heart attack Sister [phone]  Colon cancer Neg Hx    Stroke Neg Hx    Asthma Neg Hx    Esophageal cancer Neg Hx    Rectal cancer Neg Hx     Social History   Socioeconomic History   Marital status: Married    Spouse name: Not on file   Number of children: 2   Years of education: Not on file   Highest education level: Not on file  Occupational History   Occupation: service tech/retired    Employer: [employer]  Tobacco  Use   Smoking status: Former    Packs/day: 1.00    Years: 38.00    Pack years: 38.00    Types: Cigarettes    Quit date: 03/03/2003    Years since quitting: 17.9   Smokeless tobacco: Never   Tobacco comments:    smoked age [age], up to 1 ppd  Vaping Use   Vaping Use: Never used  Substance and Sexual Activity   Alcohol use: Yes    Alcohol/week: 7.0 - 14.0 standard drinks    Types: 7 - 14 Standard drinks or equivalent per week    Comment: 2 glasses of wine everyday or every other day   Drug use: No   Sexual activity: Not on file  Other Topics Concern   Not on file  Social History Narrative   Married, two daughters married.   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   Alc: 1-2 glasses of wine most days.   Tob: quit 2005.  40+  pack-yr hx.   Social Determinants of Health   Financial Resource Strain: Not on file  Food Insecurity: Not on file  Transportation Needs: Not on file  Physical Activity: Not on file  Stress: Not on file  Social Connections: Not on file  Intimate Partner Violence: Not on file    Outpatient Medications Prior to Visit  Medication Sig Dispense Refill   atorvastatin (LIPITOR) 20 MG tablet TAKE 1 TABLET BY MOUTH EVERY DAY 90 tablet 1   dicyclomine (BENTYL) 10 MG capsule Take 1 capsule (10 mg total) by mouth every 8 (eight) hours as needed for spasms. (Patient not taking: Reported on 08/03/2019) 30 capsule 1   HYDROcodone bit-homatropine (HYCODAN) 5-1.5 MG/5ML syrup 1-2 tsp po tid prn cough (Patient not taking: Reported on 02/05/2021) 120 mL 0   ibuprofen (ADVIL,MOTRIN) 200 MG tablet Take 200 mg by mouth as needed.  (Patient not taking: Reported on 02/05/2021)     irbesartan (AVAPRO) 75 MG tablet TAKE 1 TABLET BY MOUTH EVERY DAY 90 tablet 1   omeprazole (PRILOSEC) 40 MG capsule TAKE 1 CAPSULE BY MOUTH EVERY DAY 90 capsule 1   oseltamivir (TAMIFLU) 75 MG capsule 1 tab po bid (Patient not taking: Reported on 02/05/2021) 10 capsule 0   No facility-administered medications prior to visit.    No Known Allergies   PE; Vitals with BMI 02/05/2021 01/27/2021 11/13/2020  Height 5' 7"  5' 7"  5' 7"   Weight 188 lbs 192 lbs 10 oz 192 lbs  BMI 29.44 17.79 39.03  Systolic 009 233 007  Diastolic 71 83 78  Pulse 84 106 88   Gen: Alert, well appearing.  Patient is oriented to person, place, time, and situation. AFFECT: pleasant, lucid thought and speech. ENT: Ears: EACs clear, normal epithelium.  TMs with good light reflex and landmarks bilaterally.  Eyes: no injection, icteris, swelling, or exudate.  EOMI, PERRLA. Nose: no drainage or turbinate edema/swelling.  No injection or focal lesion.   Mouth: lips without lesion/swelling.  Oral mucosa pink and moist.  Dentition intact and without obvious caries or gingival swelling.  Oropharynx without erythema, exudate, or swelling.  Neck: supple/nontender.  No LAD, mass, or TM.  Carotid pulses 2+ bilaterally, without bruits. CV: RRR, no m/r/g.   LUNGS: CTA bilat, nonlabored resps, good aeration in all lung fields. ABD: soft, NT, ND, BS normal.  No hepatospenomegaly or mass.  No bruits. EXT: no clubbing, cyanosis, or edema.  Musculoskeletal: no joint swelling, erythema, warmth, or tenderness.  ROM of all joints intact. Skin - no sores or suspicious lesions or rashes or color changes  Pertinent labs:  Lab Results  Component Value Date   TSH 2.18 07/01/2016   Lab Results  Component Value Date   WBC 8.1 02/22/2020   HGB 14.5 02/22/2020   HCT 43.6 02/22/2020   MCV 89.9 02/22/2020   PLT 323.0 02/22/2020   Lab Results  Component Value Date   CREATININE 1.04 02/22/2020   BUN 20 02/22/2020   NA 139 02/22/2020   K 4.7 02/22/2020   CL 104 02/22/2020   CO2 29 02/22/2020   Lab Results  Component Value Date   ALT 27 02/22/2020   AST 21 02/22/2020   ALKPHOS 64 02/22/2020   BILITOT 0.7 02/22/2020   Lab Results  Component Value Date   CHOL 189 02/22/2020   Lab Results  Component Value Date   HDL 70.10 02/22/2020   Lab Results  Component Value Date   LDLCALC 101 (H) 02/22/2020   Lab Results  Component Value Date   TRIG 91.0 02/22/2020   Lab Results  Component Value Date   CHOLHDL 3 02/22/2020   Lab Results  Component Value Date   PSA 1.49 02/22/2020   PSA 1.45 02/08/2019   PSA 0.99 11/04/2017   Lab Results  Component Value Date   HGBA1C 5.9 02/22/2020   ASSESSMENT AND PLAN:   #1 hypertension.  Well-controlled.  Continue irbesartan 75 mg a day. Electrolytes and creatinine today.  2.  Hyperlipidemia.  Doing well on atorvastatin 20 mg a day. Fasting lipids and hepatic panel today.  #3 prediabetes.  Working  on diet and activity level. Hemoglobin A1c and fasting glucose today.  4.  Preventative health care. Prostate cancer screening with PSA today Colon cancer screening is up-to-date. Vaccines all up-to-date except shingrix-->deferred until after 03/2021 when insurance plan will start covering this vaccine.  An After Visit Summary was printed and given to the patient.  FOLLOW UP:  Return in about 6 months (around 08/06/2021) for routine chronic illness f/u.  Signed:  Crissie Sickles, MD           02/05/2021

## 2021-02-06 ENCOUNTER — Telehealth: Payer: Self-pay

## 2021-02-06 ENCOUNTER — Encounter: Payer: Self-pay | Admitting: Family Medicine

## 2021-02-06 DIAGNOSIS — R7303 Prediabetes: Secondary | ICD-10-CM

## 2021-02-06 NOTE — Telephone Encounter (Signed)
Labs signed

## 2021-02-06 NOTE — Telephone Encounter (Signed)
Spoke with pt regarding results/recommendations,voiced understanding. Pt will call back to schedule lab visit. Please clarify if you wanted CMP or fasting glucose for 3 month lab visit.  Labs pending

## 2021-02-06 NOTE — Telephone Encounter (Signed)
Fasting glucose

## 2021-02-06 NOTE — Telephone Encounter (Signed)
-----   Message from Tammi Sou, MD sent at 02/06/2021  8:19 AM EST ----- All labs excellent except Hba1c up to 6.4%--getting close to the cutoff for type 2 diabetes.  No meds at this time.  I do recommend he try to get strict on limiting intake of sweet/sugary foods, fast foods, and starch food intake (white rice, white bread, white potatoes, and white pasta).  Lab visit 3 months for fasting glucose and hemoglobin A1c, diagnosis prediabetes.

## 2021-04-01 ENCOUNTER — Telehealth: Payer: Self-pay | Admitting: Family Medicine

## 2021-04-01 NOTE — Telephone Encounter (Signed)
Spoke with patient he declined AWV do not call

## 2021-04-16 ENCOUNTER — Ambulatory Visit (INDEPENDENT_AMBULATORY_CARE_PROVIDER_SITE_OTHER): Payer: Medicare Other | Admitting: Family Medicine

## 2021-04-16 ENCOUNTER — Telehealth: Payer: Self-pay

## 2021-04-16 ENCOUNTER — Ambulatory Visit (HOSPITAL_BASED_OUTPATIENT_CLINIC_OR_DEPARTMENT_OTHER)
Admission: RE | Admit: 2021-04-16 | Discharge: 2021-04-16 | Disposition: A | Discharge status: Home / Self Care | Payer: Medicare Other | Source: Ambulatory Visit | Attending: Family Medicine | Admitting: Family Medicine

## 2021-04-16 ENCOUNTER — Other Ambulatory Visit: Payer: Self-pay

## 2021-04-16 VITALS — BP 131/81 | HR 74 | Temp 97.9°F | Ht 67.0 in | Wt 189.2 lb

## 2021-04-16 DIAGNOSIS — R079 Chest pain, unspecified: Secondary | ICD-10-CM | POA: Insufficient documentation

## 2021-04-16 DIAGNOSIS — I700 Atherosclerosis of aorta: Secondary | ICD-10-CM | POA: Diagnosis not present

## 2021-04-16 LAB — CBC WITH DIFFERENTIAL/PLATELET
Basophils Absolute: 0.1 10*3/uL (ref 0.0–0.1)
Basophils Relative: 0.8 % (ref 0.0–3.0)
Eosinophils Absolute: 0.2 10*3/uL (ref 0.0–0.7)
Eosinophils Relative: 2.6 % (ref 0.0–5.0)
HCT: 44.2 % (ref 39.0–52.0)
Hemoglobin: 14.5 g/dL (ref 13.0–17.0)
Lymphocytes Relative: 35.1 % (ref 12.0–46.0)
Lymphs Abs: 2.4 10*3/uL (ref 0.7–4.0)
MCHC: 32.9 g/dL (ref 30.0–36.0)
MCV: 88.4 fl (ref 78.0–100.0)
Monocytes Absolute: 0.8 10*3/uL (ref 0.1–1.0)
Monocytes Relative: 12.3 % — ABNORMAL HIGH (ref 3.0–12.0)
Neutro Abs: 3.4 10*3/uL (ref 1.4–7.7)
Neutrophils Relative %: 49.2 % (ref 43.0–77.0)
Platelets: 301 10*3/uL (ref 150.0–400.0)
RBC: 5 Mil/uL (ref 4.22–5.81)
RDW: 14.4 % (ref 11.5–15.5)
WBC: 6.8 10*3/uL (ref 4.0–10.5)

## 2021-04-16 NOTE — Progress Notes (Signed)
OFFICE VISIT  04/16/2021  CC:  Chief Complaint  Patient presents with   Chest Pain    Occurring since Nov on R side. Denies slight difficulty breathing. Seems worse at night and hurts more to take a deep breath    Patient is a 71 y.o. male who presents for chest pain.  HPI: Stephen Bryant began noticing some chest pain around the time of a influenza-like illness about 3 months ago.  Describes an achy pain usually in the right side of the upper chest but occasionally all across his chest.  Worse at night.  Not associated with any shortness of breath, nausea, diaphoresis, palpitations, arm or jaw pain, or dizziness.  He walks on the treadmill daily and this does not trigger it, nor does it make it better or worse.  Denies feeling of heartburn or dysphagia.  He tried taking some Tums and this did not affect it.  He takes a PPI daily for history of GERD with esophageal stricture. Father had coronary artery disease diagnosed in his 48s, sister diagnosed around age 57. Patient has 40 pack-year history of smoking, quit around 2005.  History of well-controlled hypertension and hyperlipidemia.  ROS as above, plus--> no fevers, no wheezing, no cough, no HAs, no rashes, no melena/hematochezia.  No polyuria or polydipsia.  No myalgias or arthralgias.  No focal weakness, paresthesias, or tremors.  No acute vision or hearing abnormalities.  No dysuria or unusual/new urinary urgency or frequency.  No recent changes in lower legs---no swelling or pain. No n/v/d or abd pain.    Past Medical History:  Diagnosis Date   AVM (arteriovenous malformation) of colon    Diverticulosis of colon (without mention of hemorrhage)    with recurrent diverticulitis   Esophageal stricture    Essential hypertension 2018   GERD (gastroesophageal reflux disease)    Hiatal hernia    Hypercholesterolemia    statin x 1 yr: tolerated med but was taken off it after 1 yr and lipids have been ok as of 2015.  Restarted atorva 06/2016.    Personal history of colonic polyps 04/16/2010; 08/16/19   Recall 2028   Prediabetes    a1c up to 6.4% Dec 2022   UC (ulcerative colitis) [facility])    sigmoid colon     Past Surgical History:  Procedure Laterality Date   COLONOSCOPY  2012; 08/16/19   +adenomas->recall 2028.   colonoscopy  with polypectomy  2012; 10/2013   Tubular adenoma: recall 11/2018.   ESOPHAGOGASTRODUODENOSCOPY (EGD) WITH ESOPHAGEAL DILATION  05/2014   UPPER GASTROINTESTINAL ENDOSCOPY     VASECTOMY      Outpatient Medications Prior to Visit  Medication Sig Dispense Refill   atorvastatin (LIPITOR) 20 MG tablet Take 1 tablet (20 mg total) by mouth daily. 90 tablet 3   irbesartan (AVAPRO) 75 MG tablet Take 1 tablet (75 mg total) by mouth daily. 90 tablet 3   omeprazole (PRILOSEC) 40 MG capsule TAKE 1 CAPSULE BY MOUTH EVERY DAY 90 capsule 3   No facility-administered medications prior to visit.    No Known Allergies  ROS As per HPI  PE: Vitals with BMI 04/16/2021 02/05/2021 01/27/2021  Height 5' 7"  5' 7"  5' 7"   Weight 189 lbs 3 oz 188 lbs 192 lbs 10 oz  BMI 29.63 73.22 02.54  Systolic 270 623 762  Diastolic 81 71 83  Pulse 74 84 106  02 sat 99% on RA today   Physical Exam  Gen: Alert, well appearing.  Patient is oriented  to person, place, time, and situation. AFFECT: pleasant, lucid thought and speech. QDI:YMEB: no injection, icteris, swelling, or exudate.  EOMI, PERRLA. Mouth: lips without lesion/swelling.  Oral mucosa pink and moist. Oropharynx without erythema, exudate, or swelling.  CV: RRR, no m/r/g.   LUNGS: CTA bilat, nonlabored resps, good aeration in all lung fields. ABD: soft, NT/ND EXT: no clubbing or cyanosis.  no edema.    LABS:    Chemistry      Component Value Date/Time   NA 139 02/05/2021 0854   K 4.3 02/05/2021 0854   CL 103 02/05/2021 0854   CO2 29 02/05/2021 0854   BUN 13 02/05/2021 0854   CREATININE 0.99 02/05/2021 0854   CREATININE 1.01 08/07/2016 1546      Component  Value Date/Time   CALCIUM 9.5 02/05/2021 0854   ALKPHOS 76 02/05/2021 0854   AST 22 02/05/2021 0854   ALT 26 02/05/2021 0854   BILITOT 1.0 02/05/2021 0854     Lab Results  Component Value Date   WBC 6.8 02/05/2021   HGB 14.1 02/05/2021   HCT 43.5 02/05/2021   MCV 89.3 02/05/2021   PLT 342.0 02/05/2021   Lab Results  Component Value Date   HGBA1C 6.4 02/05/2021   12 lead EKG today: NSR, rate 63, no ST or T wave changes or Q waves.  Low voltage in II, III, and aVF.  Intervals and duration normal.   Compared to EKG 03/09/12 there is no significant change.  IMPRESSION AND PLAN:  Atypical chest pain. EKG reassuring. He does have some risk factors and overall is moderate risk for coronary artery disease. Further work-up--> chest x-ray, CBC with differential, be met, D-dimer (wells score is ZERO).  Discussed cardiology referral versus obtain stress test via my order. Through shared decision making process we decided on the next step as myocardial perfusion imaging, which I ordered today.  Holding off on cardiology referral at this time.  Spent 41 min with pt today reviewing HPI, reviewing relevant past history, doing exam, reviewing and discussing lab and imaging data, and formulating plans.  An After Visit Summary was printed and given to the patient.  FOLLOW UP: Return in about 2 weeks (around 04/30/2021) for f/u cp.  Signed:  Crissie Sickles, MD           04/16/2021

## 2021-04-16 NOTE — Telephone Encounter (Signed)
[facility] - Client TELEPHONE ADVICE RECORD [facility] Patient Name: Stephen Bryant Gender: Male DOB: 1950/06/14 Age: 71 Y 43 M 27 D Return Phone Number: [phone] (Primary) Address: City/ State/ Zip: [place] [address] Client [facility] - Client Client Site [place] - Day Provider Crissie Sickles - MD Contact Type Call Who Is Calling Patient / Member / Family / Caregiver Call Type Triage / Clinical Relationship To Patient Self Return Phone Number [phone] (Primary) Chief Complaint CHEST PAIN - pain, pressure, heaviness or tightness Reason for Call Symptomatic / Request for [name] states he has been having chest pain. He states he wants to schedule appt. Translation No Nurse Assessment Nurse: Thad Ranger, RN, Denise Date/Time (Eastern Time): 04/16/2021 8:18:26 AM Confirm and document reason for call. If symptomatic, describe symptoms. ---Caller states he has been having chest pain. He has been having CP since Nov and slight diff breathing. Does the patient have any new or worsening symptoms? ---Yes Will a triage be completed? ---Yes Related visit to physician within the last 2 weeks? ---No Does the PT have any chronic conditions? (i.e. diabetes, asthma, this includes High risk factors for pregnancy, etc.) ---Yes List chronic conditions. ---HTN Is this a behavioral health or substance abuse call? ---No Guidelines Guideline Title Affirmed Question Affirmed Notes Nurse Date/Time Eilene Ghazi Time) Chest Pain [1] Chest pain lasts > 5 minutes AND [2] age > 46 Carmon, RN, Langley Gauss 04/16/2021 8:20:01 AM Disp. Time Eilene Ghazi Time) Disposition Final User 04/16/2021 8:14:29 AM Send to Urgent Queue Idolina Primer 04/16/2021 8:26:03 AM 911 Outcome Documentation Carmon, RN, Langley Gauss Reason: Pt refused to call 911 and wants to be seen in office. Called PLEASE NOTE: All timestamps  contained within this report are represented as [place] Standard Time. CONFIDENTIALTY NOTICE: This fax transmission is intended only for the addressee. It contains information that is legally privileged, confidential or otherwise protected from use or disclosure. If you are not the intended recipient, you are strictly prohibited from reviewing, disclosing, copying using or disseminating any of this information or taking any action in reliance on or regarding this information. If you have received this fax in error, please notify us immediately by telephone so that we can arrange for its return to [place]. Phone: [phone], Toll-Free: [phone], Fax: [phone] Page: 2 of 2 Call Id: [id number] [place]. Time Eilene Ghazi Time) Disposition Final User MDO BL w/o answer. Returned call to the pt and trans to the MDO for appt. 04/16/2021 8:26:14 AM Call Completed Thad Ranger, RN, Langley Gauss 04/16/2021 8:22:00 AM Call EMS 911 Now Yes Carmon, RN, Yevette Edwards Disagree/Comply Disagree Caller Understands Yes PreDisposition Call Doctor Care Advice Given Per Guideline CALL EMS 911 NOW: CARE ADVICE given per Chest Pain (Adult) guideline. Comments User: Romeo Apple, RN Date/Time Eilene Ghazi Time): 04/16/2021 8:24:38 AM No answer on BL. Referrals GO TO FACILITY REFUSED

## 2021-04-16 NOTE — Patient Instructions (Signed)
Start taking 68m aspirin daily.

## 2021-04-16 NOTE — Addendum Note (Signed)
Addended by: Tammi Sou on: 04/16/2021 05:28 PM   Modules accepted: Orders

## 2021-04-17 ENCOUNTER — Telehealth (HOSPITAL_COMMUNITY): Payer: Self-pay | Admitting: *Deleted

## 2021-04-17 ENCOUNTER — Encounter (HOSPITAL_COMMUNITY): Payer: Self-pay | Admitting: Family Medicine

## 2021-04-17 LAB — D-DIMER, QUANTITATIVE: D-Dimer, Quant: 0.35 mcg/mL FEU (ref <0.50)

## 2021-04-17 LAB — BASIC METABOLIC PANEL
BUN: 15 mg/dL (ref 6–23)
CO2: 30 mEq/L (ref 19–32)
Calcium: 9.8 mg/dL (ref 8.4–10.5)
Chloride: 102 mEq/L (ref 96–112)
Creatinine, Ser: 0.95 mg/dL (ref 0.40–1.50)
GFR: 80.4 mL/min (ref >60.00)
Glucose, Bld: 107 mg/dL — ABNORMAL HIGH (ref 70–99)
Potassium: 5.3 mEq/L — ABNORMAL HIGH (ref 3.5–5.1)
Sodium: 138 mEq/L (ref 135–145)

## 2021-04-17 NOTE — Telephone Encounter (Signed)
Close encounter 

## 2021-04-18 ENCOUNTER — Ambulatory Visit: Payer: Medicare Other | Admitting: Family Medicine

## 2021-04-18 ENCOUNTER — Other Ambulatory Visit: Payer: Self-pay

## 2021-04-18 ENCOUNTER — Ambulatory Visit (HOSPITAL_COMMUNITY)
Admission: RE | Admit: 2021-04-18 | Discharge: 2021-04-18 | Disposition: A | Discharge status: Home / Self Care | Payer: Medicare Other | Source: Ambulatory Visit | Attending: Cardiovascular Disease | Admitting: Cardiovascular Disease

## 2021-04-18 DIAGNOSIS — R079 Chest pain, unspecified: Secondary | ICD-10-CM

## 2021-04-18 HISTORY — PX: CARDIOVASCULAR STRESS TEST: SHX262

## 2021-04-18 LAB — MYOCARDIAL PERFUSION IMAGING
Angina Index: 0
Duke Treadmill Score: -2
Estimated workload: 10.1
Exercise duration (min): 8 min
Exercise duration (sec): 1 s
LV dias vol: 123 mL (ref 62–150)
LV sys vol: 68 mL
MPHR: 149 {beats}/min
Nuc Stress EF: 45 %
Peak HR: 162 {beats}/min
Percent HR: 108 %
Rest HR: 74 {beats}/min
Rest Nuclear Isotope Dose: 10.6 mCi
SDS: 1
SRS: 4
SSS: 5
ST Depression (mm): 2 mm
Stress Nuclear Isotope Dose: 32.1 mCi
TID: 1.02

## 2021-04-18 MED ORDER — TECHNETIUM TC 99M TETROFOSMIN IV KIT
32.1000 | PACK | Freq: Once | INTRAVENOUS | Status: AC | PRN
  Administered 2021-04-18: 32.1 via INTRAVENOUS
  Filled 2021-04-18: qty 33

## 2021-04-18 MED ORDER — TECHNETIUM TC 99M TETROFOSMIN IV KIT
10.6000 | PACK | Freq: Once | INTRAVENOUS | Status: AC | PRN
  Administered 2021-04-18: 10.6 via INTRAVENOUS
  Filled 2021-04-18: qty 11

## 2021-04-21 ENCOUNTER — Other Ambulatory Visit: Payer: Self-pay | Admitting: Family Medicine

## 2021-04-21 DIAGNOSIS — R9439 Abnormal result of other cardiovascular function study: Secondary | ICD-10-CM

## 2021-04-21 DIAGNOSIS — R0789 Other chest pain: Secondary | ICD-10-CM

## 2021-04-23 ENCOUNTER — Encounter: Payer: Self-pay | Admitting: Cardiology

## 2021-04-23 ENCOUNTER — Other Ambulatory Visit: Payer: Self-pay

## 2021-04-23 ENCOUNTER — Ambulatory Visit (INDEPENDENT_AMBULATORY_CARE_PROVIDER_SITE_OTHER): Payer: Medicare Other | Admitting: Cardiology

## 2021-04-23 VITALS — BP 114/90 | HR 85 | Ht 67.0 in | Wt 188.6 lb

## 2021-04-23 DIAGNOSIS — I10 Essential (primary) hypertension: Secondary | ICD-10-CM | POA: Diagnosis not present

## 2021-04-23 DIAGNOSIS — I208 Other forms of angina pectoris: Secondary | ICD-10-CM | POA: Diagnosis not present

## 2021-04-23 DIAGNOSIS — E782 Mixed hyperlipidemia: Secondary | ICD-10-CM

## 2021-04-23 DIAGNOSIS — R9439 Abnormal result of other cardiovascular function study: Secondary | ICD-10-CM

## 2021-04-23 DIAGNOSIS — R0789 Other chest pain: Secondary | ICD-10-CM | POA: Diagnosis not present

## 2021-04-23 MED ORDER — ASPIRIN EC 81 MG PO TBEC: 81.0000 mg | DELAYED_RELEASE_TABLET | Freq: Every day | ORAL | 3 refills | Status: DC

## 2021-04-23 NOTE — Progress Notes (Signed)
Primary Care Provider: Tammi Sou, MD [facility] Cardiologist: Glenetta Hew, MD Electrophysiologist: None  Clinic Note: Chief Complaint  Patient presents with   New Patient (Initial Visit)    Abnormal nuclear stress test   Chest Pain    Abnormal nuclear stress test; atypical sounding anginal/chest pain symptoms.   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  Constant chest pain that comes and goes randomly without association to exercise, very unusual for cardiac/ischemic anginal symptoms, however Myoview was grossly abnormal and therefore invasive evaluation is warranted.  Plan: Left Heart Catheterization with Coronary Angiography and Possible PCI. Add Imdur 30 mg daily.      Relevant Medications   aspirin EC 81 MG tablet   Essential hypertension (Chronic)    Well-controlled blood pressure on current dose of ARB.  Pending results, if there is indeed evidence of CAD, with start beta-blocker.      Relevant Medications   aspirin EC 81 MG tablet   Other Relevant Orders   Basic metabolic panel (Completed)   Hyperlipidemia, unspecified (Chronic)    Most recent lipids from December 2022 showed LDL of 95.  Currently on atorvastatin 20 mg daily.  Pending results of cardiac catheterization, if there is indeed evidence of CAD, would likely titrate up to a higher dose.  We will also change target LDL level based on findings.      Relevant Medications   aspirin EC 81 MG tablet     Other   Abnormal nuclear stress test - Primary    Stress test clearly has an inferior defect that does look fixed and it is difficult to tell if this is prior infarct versus artifact.  The fact that there are inferolateral ST depressions makes it more concerning for possible prior infarct versus  resting ischemia.  Intermediate risk study with atypical sounding chest pain, but findings suggestive of possibly occluded RCA or dominant LCx.  Recommended best course of action for definitive evaluation is invasive with cardiac catheterization.   Discussed the options of invasive evaluation with cardiac catheterization versus coronary CTA.-He agrees that invasive evaluation is the best option.  Plan: Left Heart Catheterization with Coronary Angiography and Possible PCI Continue aspirin, ARB and statin. Pending findings in the Left would probably add beta-blocker. Check precath chemistry level.  Shared Decision Making/Informed Consent The risks [stroke (1 in 1000), death (1 in 1000), kidney failure [usually temporary] (1 in 500), bleeding (1 in 200), allergic reaction [possibly serious] (1 in 200)], benefits (diagnostic support and management of coronary artery disease) and alternatives of a cardiac catheterization were discussed in detail with Mr. Grosso and he is willing to proceed.      Relevant Orders   Basic metabolic panel (Completed)   Other Visit Diagnoses     Atypical chest pain          ===================================  HPI:    Stephen Bryant is a 71 y.o. male who is being seen today for the evaluation of CHEST PAIN & ABNORMAL MYOVIEW STRESS TEST (reviwed below) at the request of McGowen, Adrian Blackwater, MD.  Stephen Bryant was just seen by Dr. Melford Aase in on April 16, 2021 with complaints of chest pain.  He noted that the symptoms of chest pain began shortly after he had a flulike illness roughly  3 months ago.  Actually describe the right upper chest discomfort but then across occasionally across the chest.  This was worse at night and not associated with dyspnea, nausea, vomiting or palpitations..  In fact, walking on the treadmill did not trigger it. => Noted that the patient himself has a 40-pack-year smoking history having quit in 2005 with a sister having CAD diagnosed in  her 54s and father CAD in the 55s. => After discussion with the patient, Dr. Melford Aase decided to proceed Myoview stress test.  At the results, he is referred for invasive evaluation with cardiac catheterization and possible PCI.  Recent Hospitalizations: None  Reviewed  CV studies:    The following studies were reviewed today: (if available, images/films reviewed: From Epic Chart or Care Everywhere) Myoview 04/18/2021: -> walked ~37mn (10.1 METS)  - > 162 bpm (108% MPHR) EF 45%.  FIXED INFEROSEPTAL-APICAL PERFUSION DEFECT-SCAR VERSUS ARTIFACT.  Intermediate RISK.  2 mm horizontal ST depression in inferolateral leads.  Interval History:   Stephen Bryant here today to discuss results of his abnormal Myoview.  There does appear to be a significant perfusion defect associated EKG changes and exertional dyspnea, but pretty good exercise tolerance.  Stephen Bryant basically told the same story of chest pain beginning in the end of November when he had COVID and flu illnesses.  He had a pretty significant cough and a lot of chest discomfort.  The chest comfort, it was most in the right upper chest.  He said when he went in for his annual exam with Dr. MMelford Aase he mention this chest discomfort and also may be some progression of exertional dyspnea which he thought was related to the fact that he just had a restaurant tract infection.  The pain is noted in the right upper chest worse with lying down.  He describes it as a dull aching pain and is there most all the time.  It goes up and down and intensity randomly and is not made worse with a treadmill.  He also has not really noted any significant worsening of his exertional dyspnea doing about 3 to days a week, treadmill and walks about a mile.  He really has not noted any change to his exertional dyspnea.   CV Review of Symptoms (Summary) Cardiovascular ROS: positive for - chest pain, dyspnea on exertion, and chest pain seems to be relatively atypical,  right-sided and nonexertional.  Dyspnea does not seem to be a change from baseline. negative for - edema, irregular heartbeat, orthopnea, palpitations, paroxysmal nocturnal dyspnea, rapid heart rate, or lightheadedness or dizziness or wooziness, syncope/near syncope or TIA/amaurosis fugax, claudication  REVIEWED OF SYSTEMS   Review of Systems  Constitutional:  Positive for malaise/fatigue (Has not had a lot of energy since his illnesses back in November.  Definitely has exertional/exercise intolerance.). Negative for weight loss.  HENT:  Negative for nosebleeds.   Respiratory:  Positive for shortness of breath (Per HPI).        No longer having the coughing and wheezing that he was back in November.  Cardiovascular:        Per HPI  Gastrointestinal:  Negative for abdominal pain, blood in stool, heartburn, melena and vomiting.  Genitourinary:  Negative for flank pain and hematuria.  Musculoskeletal:  Negative for joint pain.  Neurological:  Negative for dizziness, focal weakness, weakness and headaches.  Psychiatric/Behavioral:  Negative for depression and memory loss. The patient is nervous/anxious (He has been anxious since hearing the results of his  test but otherwise not routinely anxious.). The patient does not have insomnia.   All other systems reviewed and are negative.  I have reviewed and (if needed) personally updated the patient's problem list, medications, allergies, past medical and surgical history, social and family history.   PAST MEDICAL HISTORY   Past Medical History:  Diagnosis Date   AVM (arteriovenous malformation) of colon    Diverticulosis of colon (without mention of hemorrhage)    with recurrent diverticulitis   Esophageal stricture    Essential hypertension 2018   Essential hypertension 04/23/2021   GERD (gastroesophageal reflux disease)    Hiatal hernia    Hypercholesterolemia    statin x 1 yr: tolerated med but was taken off it after 1 yr and lipids have  been ok as of 2015.  Restarted atorva 06/2016.   Personal history of colonic polyps 04/16/2010; 08/16/19   Recall 2028   Prediabetes    a1c up to 6.4% Dec 2022   UC (ulcerative colitis) [facility])    sigmoid colon     PAST SURGICAL HISTORY   Past Surgical History:  Procedure Laterality Date   COLONOSCOPY  2012; 08/16/19   +adenomas->recall 2028.   colonoscopy  with polypectomy  2012; 10/2013   Tubular adenoma: recall 11/2018.   ESOPHAGOGASTRODUODENOSCOPY (EGD) WITH ESOPHAGEAL DILATION  05/2014   UPPER GASTROINTESTINAL ENDOSCOPY     VASECTOMY      Immunization History  Administered Date(s) Administered   Fluad Quad(high Dose 65+) 11/14/2018, 12/26/2019, 12/20/2020   Influenza, High Dose Seasonal PF 11/04/2017   PFIZER(Purple Top)SARS-COV-2 Vaccination 04/08/2019, 05/03/2019, 12/26/2019   Pfizer Covid-19 Vaccine Bivalent Booster 31yr & up 12/20/2020   Pneumococcal Conjugate-13 06/25/2016   Pneumococcal Polysaccharide-23 11/04/2017   Tdap 11/13/2015    MEDICATIONS/ALLERGIES   Current Meds  Medication Sig   aspirin EC 81 MG tablet Take 1 tablet (81 mg total) by mouth daily. Swallow whole.   atorvastatin (LIPITOR) 20 MG tablet Take 1 tablet (20 mg total) by mouth daily.   irbesartan (AVAPRO) 75 MG tablet Take 1 tablet (75 mg total) by mouth daily.   omeprazole (PRILOSEC) 40 MG capsule TAKE 1 CAPSULE BY MOUTH EVERY DAY    No Known Allergies  SOCIAL HISTORY/FAMILY HISTORY   Reviewed in Epic:   Social History   Tobacco Use   Smoking status: Former    Packs/day: 1.00    Years: 38.00    Pack years: 38.00    Types: Cigarettes    Quit date: 03/03/2003    Years since quitting: 18.1   Smokeless tobacco: Never   Tobacco comments:    smoked age [age], up to 1 ppd  Vaping Use   Vaping Use: Never used  Substance Use Topics   Alcohol use: Yes    Alcohol/week: 7.0 - 14.0 standard drinks    Types: 7 - 14 Standard drinks or equivalent per week    Comment: 2 glasses of wine everyday  or every other day   Drug use: No   Social History   Social History Narrative   Married, two daughters married.   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   Tob: quit 2005.  40+  pack-yr hx.   Family History  Problem Relation Age of Onset   Stomach cancer Mother    Heart attack Father 660  Diabetes Father    Coronary artery disease Father 625      CABG  in his 24s   Heart attack Sister 12   COPD Brother    Diabetes Maternal Grandmother    Colon cancer Neg Hx    Stroke Neg Hx    Asthma Neg Hx    Esophageal cancer Neg Hx    Rectal cancer Neg Hx     OBJCTIVE -PE, EKG, labs   Wt Readings from Last 3 Encounters:  04/23/21 188 lb 9.6 oz (85.5 kg)  04/18/21 189 lb (85.7 kg)  04/16/21 189 lb 3.2 oz (85.8 kg)    Physical Exam: BP 114/90    Pulse 85    Ht 5' 7"  (1.702 m)    Wt 188 lb 9.6 oz (85.5 kg)    SpO2 97%    BMI 29.54 kg/m  Physical Exam Vitals reviewed.  Constitutional:      General: He is not in acute distress.    Appearance: Normal appearance. He is obese. He is not toxic-appearing.     Comments: Well-nourished, well-groomed.  With BMI of 29.5 and cardiac risk factors, would be considered obese.  HENT:     Head: Normocephalic and atraumatic.  Neck:     Vascular: No carotid bruit.  Cardiovascular:     Rate and Rhythm: Normal rate and regular rhythm.     Pulses: Normal pulses.     Heart sounds: Normal heart sounds. No murmur heard.   No friction rub. No gallop.  Pulmonary:     Effort: Pulmonary effort is normal. No respiratory distress.     Breath sounds: Normal breath sounds. No wheezing, rhonchi or rales.  Chest:     Chest wall: No tenderness.  Abdominal:     General: Abdomen is flat.     Palpations: Abdomen is soft.  Musculoskeletal:        General: No swelling. Normal range of motion.     Cervical back: Normal range of motion and neck supple.  Skin:    General: Skin is warm and dry.   Neurological:     General: No focal deficit present.     Mental Status: He is alert and oriented to person, place, and time.     Motor: No weakness.     Gait: Gait normal.  Psychiatric:        Mood and Affect: Mood normal.        Behavior: Behavior normal.        Thought Content: Thought content normal.        Judgment: Judgment normal.     Adult ECG Report (from 04/16/2021)  Rate: 63;  Rhythm: normal sinus rhythm and low voltage in limb leads.  Normal axis intervals and durations.  No significant ST and T wave changes. ;   Narrative Interpretation: Compared to EKG 03/09/12 there is no significant change.  Recent Labs: Reviewed. Lab Results  Component Value Date   CHOL 172 02/05/2021   HDL 52.40 02/05/2021   LDLCALC 95 02/05/2021   LDLDIRECT 201.5 01/27/2012   TRIG 120.0 02/05/2021   CHOLHDL 3 02/05/2021   Lab Results  Component Value Date   CREATININE 0.91 04/23/2021   BUN 13 04/23/2021   NA 141 04/23/2021   K 5.0 04/23/2021   CL 103 04/23/2021   CO2 22 04/23/2021   CBC Latest Ref Rng & Units 04/16/2021 02/05/2021 02/22/2020  WBC 4.0 - 10.5 K/uL 6.8 6.8 8.1  Hemoglobin 13.0 - 17.0 g/dL 14.5 14.1 14.5  Hematocrit 39.0 - 52.0 % 44.2 43.5 43.6  Platelets 150.0 - 400.0 K/uL  301.0 342.0 323.0    Lab Results  Component Value Date   HGBA1C 6.4 02/05/2021   Lab Results  Component Value Date   TSH 2.18 07/01/2016    ==================================================  COVID-19 Education: The signs and symptoms of COVID-19 were discussed with the patient and how to seek care for testing (follow up with PCP or arrange E-visit).    I spent a total of 28 minutes with the patient spent in direct patient consultation.  Additional time spent with chart review  / charting (studies, outside notes, etc): 23 min Total Time: 51 min  Current medicines are reviewed at length with the patient today.  (+/- concerns) he does that he is taking 81 mg aspirin.  This visit occurred  during the SARS-CoV-2 public health emergency.  Safety protocols were in place, including screening questions prior to the visit, additional usage of staff PPE, and extensive cleaning of exam room while observing appropriate contact time as indicated for disinfecting solutions.  Notice: This dictation was prepared with Dragon dictation along with smart phrase technology. Any transcriptional errors that result from this process are unintentional and may not be corrected upon review.   Studies Ordered:  Orders Placed This Encounter  Procedures   Basic metabolic panel    Patient Instructions / Medication Changes & Studies & Tests Ordered   Patient Instructions  Medication Instructions:  No changes   *If you need a refill on your cardiac medications before your next appointment, please call your pharmacy*   Lab Work: Bmp today  If you have labs (blood work) drawn today and your tests are completely normal, you will receive your results only by: MyChart Message (if you have MyChart) OR A paper copy in the mail If you have any lab test that is abnormal or we need to change your treatment, we will call you to review the results.   Testing/Procedures: Will be at [address] = [facility] - cardia cath Lab Your physician has requested that you have a cardiac catheterization. Cardiac catheterization is used to diagnose and/or treat various heart conditions. Doctors may recommend this procedure for a number of different reasons. The most common reason is to evaluate chest pain. Chest pain can be a symptom of coronary artery disease (CAD), and cardiac catheterization can show whether plaque is narrowing or blocking your hearts arteries. This procedure is also used to evaluate the valves, as well as measure the blood flow and oxygen levels in different parts of your heart. For further information please visit [email or web address]. Please follow instruction sheet, as given.     Follow-Up: At [facility], you and your health needs are our priority.  As part of our continuing mission to provide you with exceptional heart care, we have created designated Provider Care Teams.  These Care Teams include your primary Cardiologist (physician) and Advanced Practice Providers (APPs -  Physician Assistants and Nurse Practitioners) who all work together to provide you with the care you need, when you need it.     Your next appointment:   1 month(s)  The format for your next appointment:   In Person  Provider:   Glenetta Hew, MD    Other Instructions      [address] [place] [address] Dept: [phone] Loc: [place]  04/23/2021  You are scheduled for a Cardiac Catheterization on Tuesday, February 28 with Dr. Glenetta Hew.  1. Please arrive at the [facility] (Main Entrance A) at [facility]: [address] [place], [place] [address] at 5:30 AM (This time is two hours before your procedure to ensure your preparation). Free valet parking service is available.   Special note: Every effort is made to have your procedure done on time. Please understand that emergencies sometimes delay scheduled procedures.  2. Diet: Do not eat solid foods after midnight.  The patient may have clear liquids until 5am upon the day of the procedure.  3. Labs: You will need to have blood drawn today  BMP. You do not need to be fasting.  4. Medication instructions in preparation for your procedure:   Contrast Allergy: No   Stop taking, Avapro (Irbesartan) Tuesday, February 28,    On the morning of your procedure, take your Aspirin  81 mg and any morning medicines NOT listed above.  You may use sips of water.  5. Plan for one night stay--bring personal belongings. 6. Bring a current list of your medications and current insurance cards. 7.  You MUST have a responsible person to drive you home. 8. Someone MUST be with you the first 24 hours after you arrive home or your discharge will be delayed. 9. Please wear clothes that are easy to get on and off and wear slip-on shoes.  Thank you for allowing [place] to care for you!   -- [facility]     Glenetta Hew, M.D., M.S. Interventional Cardiologist   Pager # [phone] Phone # [phone] [address] [place], [place] [address]   Thank you for choosing Heartcare at [facility]!!

## 2021-04-23 NOTE — H&P (View-Only) (Signed)
Primary Care Provider: Tammi Sou, MD [facility] Cardiologist: Glenetta Hew, MD Electrophysiologist: None  Clinic Note: Chief Complaint  Patient presents with   New Patient (Initial Visit)    Abnormal nuclear stress test   Chest Pain    Abnormal nuclear stress test; atypical sounding anginal/chest pain symptoms.   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  Constant chest pain that comes and goes randomly without association to exercise, very unusual for cardiac/ischemic anginal symptoms, however Myoview was grossly abnormal and therefore invasive evaluation is warranted.  Plan: Left Heart Catheterization with Coronary Angiography and Possible PCI. Add Imdur 30 mg daily.      Relevant Medications   aspirin EC 81 MG tablet   Essential hypertension (Chronic)    Well-controlled blood pressure on current dose of ARB.  Pending results, if there is indeed evidence of CAD, with start beta-blocker.      Relevant Medications   aspirin EC 81 MG tablet   Other Relevant Orders   Basic metabolic panel (Completed)   Hyperlipidemia, unspecified (Chronic)    Most recent lipids from December 2022 showed LDL of 95.  Currently on atorvastatin 20 mg daily.  Pending results of cardiac catheterization, if there is indeed evidence of CAD, would likely titrate up to a higher dose.  We will also change target LDL level based on findings.      Relevant Medications   aspirin EC 81 MG tablet     Other   Abnormal nuclear stress test - Primary    Stress test clearly has an inferior defect that does look fixed and it is difficult to tell if this is prior infarct versus artifact.  The fact that there are inferolateral ST depressions makes it more concerning for possible prior infarct versus  resting ischemia.  Intermediate risk study with atypical sounding chest pain, but findings suggestive of possibly occluded RCA or dominant LCx.  Recommended best course of action for definitive evaluation is invasive with cardiac catheterization.   Discussed the options of invasive evaluation with cardiac catheterization versus coronary CTA.-He agrees that invasive evaluation is the best option.  Plan: Left Heart Catheterization with Coronary Angiography and Possible PCI Continue aspirin, ARB and statin. Pending findings in the Left would probably add beta-blocker. Check precath chemistry level.  Shared Decision Making/Informed Consent The risks [stroke (1 in 1000), death (1 in 1000), kidney failure [usually temporary] (1 in 500), bleeding (1 in 200), allergic reaction [possibly serious] (1 in 200)], benefits (diagnostic support and management of coronary artery disease) and alternatives of a cardiac catheterization were discussed in detail with Stephen Bryant and he is willing to proceed.      Relevant Orders   Basic metabolic panel (Completed)   Other Visit Diagnoses     Atypical chest pain          ===================================  HPI:    Stephen Bryant is a 71 y.o. male who is being seen today for the evaluation of CHEST PAIN & ABNORMAL MYOVIEW STRESS TEST (reviwed below) at the request of McGowen, Adrian Blackwater, MD.  Stephen Bryant was just seen by Dr. Melford Aase in on April 16, 2021 with complaints of chest pain.  He noted that the symptoms of chest pain began shortly after he had a flulike illness roughly  3 months ago.  Actually describe the right upper chest discomfort but then across occasionally across the chest.  This was worse at night and not associated with dyspnea, nausea, vomiting or palpitations..  In fact, walking on the treadmill did not trigger it. => Noted that the patient himself has a 40-pack-year smoking history having quit in 2005 with a sister having CAD diagnosed in  her 61s and father CAD in the 60s. => After discussion with the patient, Dr. Melford Aase decided to proceed Myoview stress test.  At the results, he is referred for invasive evaluation with cardiac catheterization and possible PCI.  Recent Hospitalizations: None  Reviewed  CV studies:    The following studies were reviewed today: (if available, images/films reviewed: From Epic Chart or Care Everywhere) Myoview 04/18/2021: -> walked ~46mn (10.1 METS)  - > 162 bpm (108% MPHR) EF 45%.  FIXED INFEROSEPTAL-APICAL PERFUSION DEFECT-SCAR VERSUS ARTIFACT.  Intermediate RISK.  2 mm horizontal ST depression in inferolateral leads.  Interval History:   Stephen Bryant here today to discuss results of his abnormal Myoview.  There does appear to be a significant perfusion defect associated EKG changes and exertional dyspnea, but pretty good exercise tolerance.  Stephen Bryant basically told the same story of chest pain beginning in the end of November when he had COVID and flu illnesses.  He had a pretty significant cough and a lot of chest discomfort.  The chest comfort, it was most in the right upper chest.  He said when he went in for his annual exam with Dr. MMelford Aase he mention this chest discomfort and also may be some progression of exertional dyspnea which he thought was related to the fact that he just had a restaurant tract infection.  The pain is noted in the right upper chest worse with lying down.  He describes it as a dull aching pain and is there most all the time.  It goes up and down and intensity randomly and is not made worse with a treadmill.  He also has not really noted any significant worsening of his exertional dyspnea doing about 3 to days a week, treadmill and walks about a mile.  He really has not noted any change to his exertional dyspnea.   CV Review of Symptoms (Summary) Cardiovascular ROS: positive for - chest pain, dyspnea on exertion, and chest pain seems to be relatively atypical,  right-sided and nonexertional.  Dyspnea does not seem to be a change from baseline. negative for - edema, irregular heartbeat, orthopnea, palpitations, paroxysmal nocturnal dyspnea, rapid heart rate, or lightheadedness or dizziness or wooziness, syncope/near syncope or TIA/amaurosis fugax, claudication  REVIEWED OF SYSTEMS   Review of Systems  Constitutional:  Positive for malaise/fatigue (Has not had a lot of energy since his illnesses back in November.  Definitely has exertional/exercise intolerance.). Negative for weight loss.  HENT:  Negative for nosebleeds.   Respiratory:  Positive for shortness of breath (Per HPI).        No longer having the coughing and wheezing that he was back in November.  Cardiovascular:        Per HPI  Gastrointestinal:  Negative for abdominal pain, blood in stool, heartburn, melena and vomiting.  Genitourinary:  Negative for flank pain and hematuria.  Musculoskeletal:  Negative for joint pain.  Neurological:  Negative for dizziness, focal weakness, weakness and headaches.  Psychiatric/Behavioral:  Negative for depression and memory loss. The patient is nervous/anxious (He has been anxious since hearing the results of his  test but otherwise not routinely anxious.). The patient does not have insomnia.   All other systems reviewed and are negative.  I have reviewed and (if needed) personally updated the patient's problem list, medications, allergies, past medical and surgical history, social and family history.   PAST MEDICAL HISTORY   Past Medical History:  Diagnosis Date   AVM (arteriovenous malformation) of colon    Diverticulosis of colon (without mention of hemorrhage)    with recurrent diverticulitis   Esophageal stricture    Essential hypertension 2018   Essential hypertension 04/23/2021   GERD (gastroesophageal reflux disease)    Hiatal hernia    Hypercholesterolemia    statin x 1 yr: tolerated med but was taken off it after 1 yr and lipids have  been ok as of 2015.  Restarted atorva 06/2016.   Personal history of colonic polyps 04/16/2010; 08/16/19   Recall 2028   Prediabetes    a1c up to 6.4% Dec 2022   UC (ulcerative colitis) [facility])    sigmoid colon     PAST SURGICAL HISTORY   Past Surgical History:  Procedure Laterality Date   COLONOSCOPY  2012; 08/16/19   +adenomas->recall 2028.   colonoscopy  with polypectomy  2012; 10/2013   Tubular adenoma: recall 11/2018.   ESOPHAGOGASTRODUODENOSCOPY (EGD) WITH ESOPHAGEAL DILATION  05/2014   UPPER GASTROINTESTINAL ENDOSCOPY     VASECTOMY      Immunization History  Administered Date(s) Administered   Fluad Quad(high Dose 65+) 11/14/2018, 12/26/2019, 12/20/2020   Influenza, High Dose Seasonal PF 11/04/2017   PFIZER(Purple Top)SARS-COV-2 Vaccination 04/08/2019, 05/03/2019, 12/26/2019   Pfizer Covid-19 Vaccine Bivalent Booster 66yr & up 12/20/2020   Pneumococcal Conjugate-13 06/25/2016   Pneumococcal Polysaccharide-23 11/04/2017   Tdap 11/13/2015    MEDICATIONS/ALLERGIES   Current Meds  Medication Sig   aspirin EC 81 MG tablet Take 1 tablet (81 mg total) by mouth daily. Swallow whole.   atorvastatin (LIPITOR) 20 MG tablet Take 1 tablet (20 mg total) by mouth daily.   irbesartan (AVAPRO) 75 MG tablet Take 1 tablet (75 mg total) by mouth daily.   omeprazole (PRILOSEC) 40 MG capsule TAKE 1 CAPSULE BY MOUTH EVERY DAY    No Known Allergies  SOCIAL HISTORY/FAMILY HISTORY   Reviewed in Epic:   Social History   Tobacco Use   Smoking status: Former    Packs/day: 1.00    Years: 38.00    Pack years: 38.00    Types: Cigarettes    Quit date: 03/03/2003    Years since quitting: 18.1   Smokeless tobacco: Never   Tobacco comments:    smoked age [age], up to 1 ppd  Vaping Use   Vaping Use: Never used  Substance Use Topics   Alcohol use: Yes    Alcohol/week: 7.0 - 14.0 standard drinks    Types: 7 - 14 Standard drinks or equivalent per week    Comment: 2 glasses of wine everyday  or every other day   Drug use: No   Social History   Social History Narrative   Married, two daughters married.   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   Tob: quit 2005.  40+  pack-yr hx.   Family History  Problem Relation Age of Onset   Stomach cancer Mother    Heart attack Father 62  Diabetes Father    Coronary artery disease Father 675      CABG  in his 76s   Heart attack Sister 63   COPD Brother    Diabetes Maternal Grandmother    Colon cancer Neg Hx    Stroke Neg Hx    Asthma Neg Hx    Esophageal cancer Neg Hx    Rectal cancer Neg Hx     OBJCTIVE -PE, EKG, labs   Wt Readings from Last 3 Encounters:  04/23/21 188 lb 9.6 oz (85.5 kg)  04/18/21 189 lb (85.7 kg)  04/16/21 189 lb 3.2 oz (85.8 kg)    Physical Exam: BP 114/90    Pulse 85    Ht 5' 7"  (1.702 m)    Wt 188 lb 9.6 oz (85.5 kg)    SpO2 97%    BMI 29.54 kg/m  Physical Exam Vitals reviewed.  Constitutional:      General: He is not in acute distress.    Appearance: Normal appearance. He is obese. He is not toxic-appearing.     Comments: Well-nourished, well-groomed.  With BMI of 29.5 and cardiac risk factors, would be considered obese.  HENT:     Head: Normocephalic and atraumatic.  Neck:     Vascular: No carotid bruit.  Cardiovascular:     Rate and Rhythm: Normal rate and regular rhythm.     Pulses: Normal pulses.     Heart sounds: Normal heart sounds. No murmur heard.   No friction rub. No gallop.  Pulmonary:     Effort: Pulmonary effort is normal. No respiratory distress.     Breath sounds: Normal breath sounds. No wheezing, rhonchi or rales.  Chest:     Chest wall: No tenderness.  Abdominal:     General: Abdomen is flat.     Palpations: Abdomen is soft.  Musculoskeletal:        General: No swelling. Normal range of motion.     Cervical back: Normal range of motion and neck supple.  Skin:    General: Skin is warm and dry.   Neurological:     General: No focal deficit present.     Mental Status: He is alert and oriented to person, place, and time.     Motor: No weakness.     Gait: Gait normal.  Psychiatric:        Mood and Affect: Mood normal.        Behavior: Behavior normal.        Thought Content: Thought content normal.        Judgment: Judgment normal.     Adult ECG Report (from 04/16/2021)  Rate: 63;  Rhythm: normal sinus rhythm and low voltage in limb leads.  Normal axis intervals and durations.  No significant ST and T wave changes. ;   Narrative Interpretation: Compared to EKG 03/09/12 there is no significant change.  Recent Labs: Reviewed. Lab Results  Component Value Date   CHOL 172 02/05/2021   HDL 52.40 02/05/2021   LDLCALC 95 02/05/2021   LDLDIRECT 201.5 01/27/2012   TRIG 120.0 02/05/2021   CHOLHDL 3 02/05/2021   Lab Results  Component Value Date   CREATININE 0.91 04/23/2021   BUN 13 04/23/2021   NA 141 04/23/2021   K 5.0 04/23/2021   CL 103 04/23/2021   CO2 22 04/23/2021   CBC Latest Ref Rng & Units 04/16/2021 02/05/2021 02/22/2020  WBC 4.0 - 10.5 K/uL 6.8 6.8 8.1  Hemoglobin 13.0 - 17.0 g/dL 14.5 14.1 14.5  Hematocrit 39.0 - 52.0 % 44.2 43.5 43.6  Platelets 150.0 - 400.0 K/uL  301.0 342.0 323.0    Lab Results  Component Value Date   HGBA1C 6.4 02/05/2021   Lab Results  Component Value Date   TSH 2.18 07/01/2016    ==================================================  COVID-19 Education: The signs and symptoms of COVID-19 were discussed with the patient and how to seek care for testing (follow up with PCP or arrange E-visit).    I spent a total of 28 minutes with the patient spent in direct patient consultation.  Additional time spent with chart review  / charting (studies, outside notes, etc): 23 min Total Time: 51 min  Current medicines are reviewed at length with the patient today.  (+/- concerns) he does that he is taking 81 mg aspirin.  This visit occurred  during the SARS-CoV-2 public health emergency.  Safety protocols were in place, including screening questions prior to the visit, additional usage of staff PPE, and extensive cleaning of exam room while observing appropriate contact time as indicated for disinfecting solutions.  Notice: This dictation was prepared with Dragon dictation along with smart phrase technology. Any transcriptional errors that result from this process are unintentional and may not be corrected upon review.   Studies Ordered:  Orders Placed This Encounter  Procedures   Basic metabolic panel    Patient Instructions / Medication Changes & Studies & Tests Ordered   Patient Instructions  Medication Instructions:  No changes   *If you need a refill on your cardiac medications before your next appointment, please call your pharmacy*   Lab Work: Bmp today  If you have labs (blood work) drawn today and your tests are completely normal, you will receive your results only by: MyChart Message (if you have MyChart) OR A paper copy in the mail If you have any lab test that is abnormal or we need to change your treatment, we will call you to review the results.   Testing/Procedures: Will be at [address] = [facility] - cardia cath Lab Your physician has requested that you have a cardiac catheterization. Cardiac catheterization is used to diagnose and/or treat various heart conditions. Doctors may recommend this procedure for a number of different reasons. The most common reason is to evaluate chest pain. Chest pain can be a symptom of coronary artery disease (CAD), and cardiac catheterization can show whether plaque is narrowing or blocking your hearts arteries. This procedure is also used to evaluate the valves, as well as measure the blood flow and oxygen levels in different parts of your heart. For further information please visit HugeFiesta.tn. Please follow instruction sheet, as given.     Follow-Up: At [facility], you and your health needs are our priority.  As part of our continuing mission to provide you with exceptional heart care, we have created designated Provider Care Teams.  These Care Teams include your primary Cardiologist (physician) and Advanced Practice Providers (APPs -  Physician Assistants and Nurse Practitioners) who all work together to provide you with the care you need, when you need it.     Your next appointment:   1 month(s)  The format for your next appointment:   In Person  Provider:   Glenetta Hew, MD    Other Instructions      [place] [address] Dept: [phone] Loc: [place]  04/23/2021  You are scheduled for a Cardiac Catheterization on Tuesday, February 28 with Dr. Glenetta Hew.  1. Please arrive at the [facility] (Main Entrance A) at [facility]: [address] [place], [place] [address] at 5:30 AM (This time is two hours before your procedure to ensure your preparation). Free valet parking service is available.   Special note: Every effort is made to have your procedure done on time. Please understand that emergencies sometimes delay scheduled procedures.  2. Diet: Do not eat solid foods after midnight.  The patient may have clear liquids until 5am upon the day of the procedure.  3. Labs: You will need to have blood drawn today  BMP. You do not need to be fasting.  4. Medication instructions in preparation for your procedure:   Contrast Allergy: No   Stop taking, Avapro (Irbesartan) Tuesday, February 28,    On the morning of your procedure, take your Aspirin  81 mg and any morning medicines NOT listed above.  You may use sips of water.  5. Plan for one night stay--bring personal belongings. 6. Bring a current list of your medications and current insurance cards. 7.  You MUST have a responsible person to drive you home. 8. Someone MUST be with you the first 24 hours after you arrive home or your discharge will be delayed. 9. Please wear clothes that are easy to get on and off and wear slip-on shoes.  Thank you for allowing [place] to care for you!   -- [place] [facility]     Glenetta Hew, M.D., M.S. Interventional Cardiologist   Pager # [phone] Phone # [phone] [address] [place],  [address]   Thank you for choosing Heartcare at [facility]!!

## 2021-04-23 NOTE — Patient Instructions (Addendum)
Medication Instructions:  No changes   *If you need a refill on your cardiac medications before your next appointment, please call your pharmacy*   Lab Work: Bmp today  If you have labs (blood work) drawn today and your tests are completely normal, you will receive your results only by: [place] (if you have MyChart) OR A paper copy in the mail If you have any lab test that is abnormal or we need to change your treatment, we will call you to review the results.   Testing/Procedures: Will be at [address] = [facility] - cardia cath Lab Your physician has requested that you have a cardiac catheterization. Cardiac catheterization is used to diagnose and/or treat various heart conditions. Doctors may recommend this procedure for a number of different reasons. The most common reason is to evaluate chest pain. Chest pain can be a symptom of coronary artery disease (CAD), and cardiac catheterization can show whether plaque is narrowing or blocking your hearts arteries. This procedure is also used to evaluate the valves, as well as measure the blood flow and oxygen levels in different parts of your heart. For further information please visit HugeFiesta.tn. Please follow instruction sheet, as given.    Follow-Up: At [facility], you and your health needs are our priority.  As part of our continuing mission to provide you with exceptional heart care, we have created designated Provider Care Teams.  These Care Teams include your primary Cardiologist (physician) and Advanced Practice Providers (APPs -  Physician Assistants and Nurse Practitioners) who all work together to provide you with the care you need, when you need it.     Your next appointment:   1 month(s)  The format for your next appointment:   In Person  Provider:   Glenetta Hew, MD    Other Instructions      [address] [place] [address] Dept: [phone] Loc: [place]  04/23/2021  You are scheduled for a Cardiac Catheterization on Tuesday, February 28 with Dr. Glenetta Hew.  1. Please arrive at the [facility] (Main Entrance A) at [facility]: [address] [place], [place] [address] at 5:30 AM (This time is two hours before your procedure to ensure your preparation). Free valet parking service is available.   Special note: Every effort is made to have your procedure done on time. Please understand that emergencies sometimes delay scheduled procedures.  2. Diet: Do not eat solid foods after midnight.  The patient may have clear liquids until 5am upon the day of the procedure.  3. Labs: You will need to have blood drawn today  BMP. You do not need to be fasting.  4. Medication instructions in preparation for your procedure:   Contrast Allergy: No   Stop taking, Avapro (Irbesartan) Tuesday, February 28,    On the morning of your procedure, take your Aspirin  81 mg and any morning medicines NOT listed above.  You may use sips of water.  5. Plan for one night stay--bring personal belongings. 6. Bring a current list of your medications and current insurance cards. 7. You MUST have a responsible person to drive you home. 8. Someone MUST be with you the first 24 hours after you arrive home or your discharge will be delayed. 9. Please wear clothes that are easy to get on and off and wear slip-on shoes.  Thank you  for allowing [place] to care for you!   -- [place] [facility]

## 2021-04-24 LAB — BASIC METABOLIC PANEL
BUN/Creatinine Ratio: 14 (ref 10–24)
BUN: 13 mg/dL (ref 8–27)
CO2: 22 mmol/L (ref 20–29)
Calcium: 10 mg/dL (ref 8.6–10.2)
Chloride: 103 mmol/L (ref 96–106)
Creatinine, Ser: 0.91 mg/dL (ref 0.76–1.27)
Glucose: 101 mg/dL — ABNORMAL HIGH (ref 70–99)
Potassium: 5 mmol/L (ref 3.5–5.2)
Sodium: 141 mmol/L (ref 134–144)
eGFR: 90 mL/min/{1.73_m2} (ref >59)

## 2021-04-25 ENCOUNTER — Encounter: Payer: Self-pay | Admitting: Cardiology

## 2021-04-25 NOTE — Assessment & Plan Note (Addendum)
Very unusual chest pain symptoms with no real change to baseline exertional dyspnea.  Constant chest pain that comes and goes randomly without association to exercise, very unusual for cardiac/ischemic anginal symptoms, however Myoview was grossly abnormal and therefore invasive evaluation is warranted.  Plan: Left Heart Catheterization with Coronary Angiography and Possible PCI.  Add Imdur 30 mg daily.

## 2021-04-25 NOTE — Assessment & Plan Note (Signed)
Well-controlled blood pressure on current dose of ARB.  Pending results, if there is indeed evidence of CAD, with start beta-blocker.

## 2021-04-25 NOTE — Assessment & Plan Note (Addendum)
Stress test clearly has an inferior defect that does look fixed and it is difficult to tell if this is prior infarct versus artifact.  The fact that there are inferolateral ST depressions makes it more concerning for possible prior infarct versus resting ischemia.  Intermediate risk study with atypical sounding chest pain, but findings suggestive of possibly occluded RCA or dominant LCx.  Recommended best course of action for definitive evaluation is invasive with cardiac catheterization.   Discussed the options of invasive evaluation with cardiac catheterization versus coronary CTA.-He agrees that invasive evaluation is the best option.  Plan:  Left Heart Catheterization with Coronary Angiography and Possible PCI  Continue aspirin, ARB and statin.  Pending findings in the Left would probably add beta-blocker.  Check precath chemistry level.  Shared Decision Making/Informed Consent The risks [stroke (1 in 1000), death (1 in 1000), kidney failure [usually temporary] (1 in 500), bleeding (1 in 200), allergic reaction [possibly serious] (1 in 200)], benefits (diagnostic support and management of coronary artery disease) and alternatives of a cardiac catheterization were discussed in detail with Stephen Bryant and he is willing to proceed.

## 2021-04-25 NOTE — Assessment & Plan Note (Signed)
Most recent lipids from December 2022 showed LDL of 95.  Currently on atorvastatin 20 mg daily.  Pending results of cardiac catheterization, if there is indeed evidence of CAD, would likely titrate up to a higher dose.  We will also change target LDL level based on findings.

## 2021-04-28 ENCOUNTER — Telehealth: Payer: Self-pay | Admitting: *Deleted

## 2021-04-28 NOTE — Telephone Encounter (Signed)
Cardiac catheterization scheduled at [facility] for: Tuesday April 29, 2021 7:30 [facility] Main Entrance [facility]) at: 5:30 AM   Diet-no solid food after midnight prior to cath, clear liquids until 5 AM day of procedure. CONTRAST ALLERGY:  Medication instructions for procedure: -Usual morning medications can be taken pre-cath with sips of water including aspirin 81 mg.    Must have responsible adult to drive home post procedure and be with patient first 24 hours after arriving home.  [facility] does allow one visitor to wait in the waiting room during the time you are there.   Patient reports does not currently have any new symptoms concerning for COVID-19 and no household members with COVID-19 like illness.   Reviewed procedure instructions with patient.        '

## 2021-04-29 ENCOUNTER — Ambulatory Visit (HOSPITAL_COMMUNITY)
Admission: RE | Admit: 2021-04-29 | Discharge: 2021-04-29 | Disposition: A | Discharge status: Home / Self Care | Payer: Medicare Other | Attending: Cardiology | Admitting: Cardiology

## 2021-04-29 ENCOUNTER — Encounter (HOSPITAL_COMMUNITY)
Admission: RE | Disposition: A | Discharge status: Home / Self Care | Payer: Self-pay | Source: Home / Self Care | Attending: Cardiology

## 2021-04-29 ENCOUNTER — Encounter (HOSPITAL_COMMUNITY): Payer: Self-pay | Admitting: Cardiology

## 2021-04-29 ENCOUNTER — Other Ambulatory Visit: Payer: Self-pay

## 2021-04-29 DIAGNOSIS — I10 Essential (primary) hypertension: Secondary | ICD-10-CM | POA: Diagnosis not present

## 2021-04-29 DIAGNOSIS — I25119 Atherosclerotic heart disease of native coronary artery with unspecified angina pectoris: Secondary | ICD-10-CM | POA: Insufficient documentation

## 2021-04-29 DIAGNOSIS — I25118 Atherosclerotic heart disease of native coronary artery with other forms of angina pectoris: Secondary | ICD-10-CM | POA: Diagnosis not present

## 2021-04-29 DIAGNOSIS — E785 Hyperlipidemia, unspecified: Secondary | ICD-10-CM | POA: Insufficient documentation

## 2021-04-29 DIAGNOSIS — Z8616 Personal history of COVID-19: Secondary | ICD-10-CM | POA: Diagnosis not present

## 2021-04-29 DIAGNOSIS — Z7982 Long term (current) use of aspirin: Secondary | ICD-10-CM | POA: Insufficient documentation

## 2021-04-29 DIAGNOSIS — R0609 Other forms of dyspnea: Secondary | ICD-10-CM | POA: Diagnosis not present

## 2021-04-29 DIAGNOSIS — Z87891 Personal history of nicotine dependence: Secondary | ICD-10-CM | POA: Insufficient documentation

## 2021-04-29 DIAGNOSIS — I208 Other forms of angina pectoris: Secondary | ICD-10-CM | POA: Diagnosis present

## 2021-04-29 DIAGNOSIS — R0789 Other chest pain: Secondary | ICD-10-CM

## 2021-04-29 DIAGNOSIS — I2089 Other forms of angina pectoris: Secondary | ICD-10-CM | POA: Diagnosis present

## 2021-04-29 DIAGNOSIS — R9439 Abnormal result of other cardiovascular function study: Secondary | ICD-10-CM | POA: Diagnosis present

## 2021-04-29 HISTORY — PX: LEFT HEART CATH AND CORONARY ANGIOGRAPHY: CATH118249

## 2021-04-29 SURGERY — LEFT HEART CATH AND CORONARY ANGIOGRAPHY
Anesthesia: LOCAL

## 2021-04-29 MED ORDER — SODIUM CHLORIDE 0.9% FLUSH: 3.0000 mL | Freq: Two times a day (BID) | INTRAVENOUS | Status: DC

## 2021-04-29 MED ORDER — ADENOSINE 12 MG/4ML IV SOLN
INTRAVENOUS | Status: AC
  Filled 2021-04-29: qty 16

## 2021-04-29 MED ORDER — HEPARIN (PORCINE) IN NACL 1000-0.9 UT/500ML-% IV SOLN
INTRAVENOUS | Status: DC | PRN
  Administered 2021-04-29 (×2): 500 mL

## 2021-04-29 MED ORDER — VERAPAMIL HCL 2.5 MG/ML IV SOLN
INTRAVENOUS | Status: AC
  Filled 2021-04-29: qty 2

## 2021-04-29 MED ORDER — FENTANYL CITRATE (PF) 100 MCG/2ML IJ SOLN
INTRAMUSCULAR | Status: DC | PRN
Start: 2021-04-29 — End: 2021-04-29
  Administered 2021-04-29: 25 ug via INTRAVENOUS

## 2021-04-29 MED ORDER — SODIUM CHLORIDE 0.9 % WEIGHT BASED INFUSION: 1.0000 mL/kg/h | INTRAVENOUS | Status: DC

## 2021-04-29 MED ORDER — FENTANYL CITRATE (PF) 100 MCG/2ML IJ SOLN
INTRAMUSCULAR | Status: AC
  Filled 2021-04-29: qty 2

## 2021-04-29 MED ORDER — SODIUM CHLORIDE 0.9% FLUSH: 3.0000 mL | INTRAVENOUS | Status: DC | PRN

## 2021-04-29 MED ORDER — HYDRALAZINE HCL 20 MG/ML IJ SOLN: 10.0000 mg | INTRAMUSCULAR | Status: DC | PRN

## 2021-04-29 MED ORDER — SODIUM CHLORIDE 0.9 % IV SOLN: 250.0000 mL | INTRAVENOUS | Status: DC | PRN

## 2021-04-29 MED ORDER — MIDAZOLAM HCL 2 MG/2ML IJ SOLN
INTRAMUSCULAR | Status: DC | PRN
  Administered 2021-04-29: 1 mg via INTRAVENOUS

## 2021-04-29 MED ORDER — SODIUM CHLORIDE 0.9 % WEIGHT BASED INFUSION
3.0000 mL/kg/h | INTRAVENOUS | Status: AC
  Administered 2021-04-29: 3 mL/kg/h via INTRAVENOUS

## 2021-04-29 MED ORDER — LABETALOL HCL 5 MG/ML IV SOLN: 10.0000 mg | INTRAVENOUS | Status: DC | PRN

## 2021-04-29 MED ORDER — IOHEXOL 350 MG/ML SOLN
INTRAVENOUS | Status: DC | PRN
Start: 2021-04-29 — End: 2021-04-29
  Administered 2021-04-29: 95 mL

## 2021-04-29 MED ORDER — LIDOCAINE HCL (PF) 1 % IJ SOLN
INTRAMUSCULAR | Status: DC | PRN
Start: 2021-04-29 — End: 2021-04-29
  Administered 2021-04-29: 2 mL

## 2021-04-29 MED ORDER — ACETAMINOPHEN 325 MG PO TABS: 650.0000 mg | ORAL_TABLET | ORAL | Status: DC | PRN

## 2021-04-29 MED ORDER — HEPARIN (PORCINE) IN NACL 1000-0.9 UT/500ML-% IV SOLN
INTRAVENOUS | Status: AC
Start: 2021-04-29 — End: ?
  Filled 2021-04-29: qty 1000

## 2021-04-29 MED ORDER — ADENOSINE (DIAGNOSTIC) 140MCG/KG/MIN
INTRAVENOUS | Status: DC | PRN
Start: 2021-04-29 — End: 2021-04-29
  Administered 2021-04-29: 140 ug/kg/min via INTRAVENOUS

## 2021-04-29 MED ORDER — LIDOCAINE HCL (PF) 1 % IJ SOLN
INTRAMUSCULAR | Status: AC
  Filled 2021-04-29: qty 30

## 2021-04-29 MED ORDER — MIDAZOLAM HCL 2 MG/2ML IJ SOLN
INTRAMUSCULAR | Status: AC
  Filled 2021-04-29: qty 2

## 2021-04-29 MED ORDER — VERAPAMIL HCL 2.5 MG/ML IV SOLN
INTRAVENOUS | Status: DC | PRN
  Administered 2021-04-29: 10 mL via INTRA_ARTERIAL

## 2021-04-29 MED ORDER — ONDANSETRON HCL 4 MG/2ML IJ SOLN: 4.0000 mg | Freq: Four times a day (QID) | INTRAMUSCULAR | Status: DC | PRN

## 2021-04-29 MED ORDER — HEPARIN SODIUM (PORCINE) 1000 UNIT/ML IJ SOLN
INTRAMUSCULAR | Status: DC | PRN
  Administered 2021-04-29: 4000 [IU] via INTRAVENOUS
  Administered 2021-04-29: 2000 [IU] via INTRAVENOUS
  Administered 2021-04-29: 5000 [IU] via INTRAVENOUS

## 2021-04-29 MED ORDER — HEPARIN SODIUM (PORCINE) 1000 UNIT/ML IJ SOLN
INTRAMUSCULAR | Status: AC
  Filled 2021-04-29: qty 10

## 2021-04-29 SURGICAL SUPPLY — 12 items
CATH OPTITORQUE TIG 4.0 5F (CATHETERS) ×1 IMPLANT
CATH VISTA GUIDE 6FR XBLAD3.5 (CATHETERS) ×1 IMPLANT
DEVICE RAD COMP TR BAND LRG (VASCULAR PRODUCTS) ×1 IMPLANT
GLIDESHEATH SLEND SS 6F .021 (SHEATH) ×1 IMPLANT
GUIDEWIRE INQWIRE 1.5J.035X260 (WIRE) IMPLANT
GUIDEWIRE PRESSURE X 175 (WIRE) ×1 IMPLANT
INQWIRE 1.5J .035X260CM (WIRE) ×2
KIT HEART LEFT (KITS) ×2 IMPLANT
PACK CARDIAC CATHETERIZATION (CUSTOM PROCEDURE TRAY) ×2 IMPLANT
SYR MEDRAD MARK 7 150ML (SYRINGE) ×2 IMPLANT
TRANSDUCER W/STOPCOCK (MISCELLANEOUS) ×2 IMPLANT
TUBING CIL FLEX 10 FLL-RA (TUBING) ×2 IMPLANT

## 2021-04-29 NOTE — Interval H&P Note (Signed)
History and Physical Interval Note:  04/29/2021 7:29 AM  Stephen Bryant  has presented today for surgery, with the diagnosis of abnormal nuclear stress test.  The various methods of treatment have been discussed with the patient and family. After consideration of risks, benefits and other options for treatment, the patient has consented to  Procedure(s): LEFT HEART CATH AND CORONARY ANGIOGRAPHY (N/A) as a surgical intervention.   Cath Lab Visit (complete for each Cath Lab visit)  Clinical Evaluation Leading to the Procedure:   ACS: No.  Non-ACS:    Anginal Classification: CCS III  Anti-ischemic medical therapy: Minimal Therapy (1 class of medications)  Non-Invasive Test Results: Intermediate-risk stress test findings: cardiac mortality 1-3%/year  Prior CABG: No previous CABG   The patient's history has been reviewed, patient examined, no change in status, stable for surgery.  I have reviewed the patient's chart and labs.  Questions were answered to the patient's satisfaction.     Glenetta Hew

## 2021-04-30 ENCOUNTER — Ambulatory Visit: Payer: Medicare Other | Admitting: Family Medicine

## 2021-04-30 ENCOUNTER — Encounter: Payer: Self-pay | Admitting: Family Medicine

## 2021-04-30 LAB — POCT ACTIVATED CLOTTING TIME: Activated Clotting Time: 257 seconds

## 2021-05-07 ENCOUNTER — Ambulatory Visit (INDEPENDENT_AMBULATORY_CARE_PROVIDER_SITE_OTHER): Payer: Medicare Other

## 2021-05-07 ENCOUNTER — Other Ambulatory Visit: Payer: Self-pay

## 2021-05-07 DIAGNOSIS — R7303 Prediabetes: Secondary | ICD-10-CM

## 2021-05-07 DIAGNOSIS — U071 COVID-19: Secondary | ICD-10-CM | POA: Diagnosis not present

## 2021-05-07 LAB — HEMOGLOBIN A1C: Hgb A1c MFr Bld: 6 % (ref 4.6–6.5)

## 2021-05-08 ENCOUNTER — Encounter: Payer: Self-pay | Admitting: Gastroenterology

## 2021-05-08 ENCOUNTER — Ambulatory Visit: Payer: Medicare Other

## 2021-05-08 ENCOUNTER — Ambulatory Visit (INDEPENDENT_AMBULATORY_CARE_PROVIDER_SITE_OTHER): Payer: Medicare Other | Admitting: Gastroenterology

## 2021-05-08 ENCOUNTER — Other Ambulatory Visit: Payer: Medicare Other

## 2021-05-08 VITALS — BP 120/80 | HR 76 | Ht 67.0 in | Wt 188.2 lb

## 2021-05-08 DIAGNOSIS — R0789 Other chest pain: Secondary | ICD-10-CM

## 2021-05-08 DIAGNOSIS — Z86010 Personal history of colonic polyps: Secondary | ICD-10-CM

## 2021-05-08 DIAGNOSIS — I208 Other forms of angina pectoris: Secondary | ICD-10-CM | POA: Diagnosis not present

## 2021-05-08 DIAGNOSIS — K2100 Gastro-esophageal reflux disease with esophagitis, without bleeding: Secondary | ICD-10-CM

## 2021-05-08 LAB — GLUCOSE, FASTING: Glucose, Bld: 101 mg/dL — ABNORMAL HIGH (ref 65–99)

## 2021-05-08 NOTE — Progress Notes (Signed)
? ? ?  History of Present Illness: This is a 71 year old male with chest pain.  He states left upper anterior chest pain started in September 2022 with an RSV infection.  This was unfortunately followed by a COVID infection and then influenza.  He had a chest x-ray at that time that was negative.  He had persistent coughing at the time and his PCP felt the pain was related to coughing.  His left upper anterior chest pain continued and occasionally would spread across his upper chest.  It did not worsen with meals or exertion.  He has had no active reflux symptoms since beginning omeprazole in 2016.  Recent cardiac catheterization showed nonocclusive coronary disease. Denies weight loss, abdominal pain, constipation, diarrhea, change in stool caliber, melena, hematochezia, nausea, vomiting, dysphagia, reflux symptoms ?  ? ?EGD 05/2014 ?LA Grade B esophagitis ?Esophageal stricture ?Small hiatal hernia  ? ?Current Medications, Allergies, Past Medical History, Past Surgical History, Family History and Social History were reviewed in [employer] record. ? ? ?Physical Exam: ?General: Well developed, well nourished, no acute distress ?Head: Normocephalic and atraumatic ?Eyes: Sclerae anicteric, EOMI ?Ears: Normal auditory acuity ?Mouth: Not examined, mask on during Covid-19 pandemic ?Lungs: Clear throughout to auscultation ?Chest: No chest wall tenderness appreciated ?Heart: Regular rate and rhythm; no murmurs, rubs or bruits ?Abdomen: Soft, non tender and non distended. No masses, hepatosplenomegaly or hernias noted. Normal Bowel sounds ?Rectal: Not done  ?Musculoskeletal: Symmetrical with no gross deformities  ?Pulses:  Normal pulses noted ?Extremities: No clubbing, cyanosis, edema or deformities noted ?Neurological: Alert oriented x 4, grossly nonfocal ?Psychological:  Alert and cooperative. Normal mood and affect ? ? ?Assessment and Recommendations: ? ?Atypical chest pain located in the left upper  anterior chest that occasionally spreads across the entire upper chest.  Symptoms are not typical for reflux which appears to be well controlled on daily omeprazole.  Offered options of intensifying antireflux therapy or proceeding with EGD to further evaluate and he prefers former.  Increase omeprazole to 40 mg p.o. twice daily.  Closely follow antireflux measures.  If symptoms are persistent consider EGD or return to primary care for evaluation of chest wall etiologies. ?GERD.  History of LA class B esophagitis and an esophageal stricture.  See #1. ?Personal history of adenomatous colon polyps.  Surveillance colonoscopy recommended in June 2028. ?

## 2021-05-08 NOTE — Patient Instructions (Addendum)
Increase your Omeprazole 40 mg to 1 capsule twice daily. ? ?Follow anti-reflux measures that have been given to you today. ? ?You have been scheduled to follow up with Dr. Fuller Plan on June 02, 2021 at 10:50 am. ? ? ? ? ?

## 2021-05-13 NOTE — Telephone Encounter (Signed)
Pt called back, informed him of AWV. ?Pt said he would call back he wants to think about if he needs to schedule it or not.  ?

## 2021-05-13 NOTE — Telephone Encounter (Signed)
Noted  

## 2021-05-13 NOTE — Telephone Encounter (Signed)
LVM for pt to CB and schedule Medicare Annual Wellness Visit (AWV).   ?

## 2021-05-27 ENCOUNTER — Ambulatory Visit: Payer: Medicare Other | Admitting: Cardiology

## 2021-06-02 ENCOUNTER — Ambulatory Visit: Payer: 59 | Admitting: Gastroenterology

## 2021-08-06 ENCOUNTER — Ambulatory Visit: Payer: Medicare Other | Admitting: Family Medicine

## 2021-08-12 ENCOUNTER — Telehealth: Payer: Self-pay

## 2021-08-12 NOTE — Telephone Encounter (Signed)
Spoke with pt to schedule AWV in office. Patient declined to schedule wellness visit at this time.

## 2021-11-04 ENCOUNTER — Telehealth: Payer: Self-pay

## 2021-11-04 NOTE — Telephone Encounter (Signed)
Called pt to schedule AWV. Please schedule with health coach or Lorriane Shire.

## 2021-11-14 ENCOUNTER — Telehealth: Payer: Self-pay | Admitting: *Deleted

## 2021-11-14 NOTE — Patient Outreach (Signed)
  Care Coordination   11/14/2021 Name: Stephen Bryant MRN: [id number] DOB: 27-Oct-1950   Care Coordination Outreach Attempts:  An unsuccessful telephone outreach was attempted today to offer the patient information about available care coordination services as a benefit of their health plan.   Follow Up Plan:  Additional outreach attempts will be made to offer the patient care coordination information and services.   Encounter Outcome:  No Answer  Care Coordination Interventions Activated:  No   Care Coordination Interventions:  No, not indicated    Raina Mina, RN Care Management Coordinator [place] Office [phone]

## 2021-11-28 ENCOUNTER — Telehealth: Payer: Self-pay

## 2021-11-28 NOTE — Patient Outreach (Signed)
  Care Coordination   11/28/2021 Name: Stephen Bryant MRN: [id number] DOB: 1950/11/12   Care Coordination Outreach Attempts:  A second unsuccessful outreach was attempted today to offer the patient with information about available care coordination services as a benefit of their health plan.     Follow Up Plan:  Additional outreach attempts will be made to offer the patient care coordination information and services.   Encounter Outcome:  No Answer  Care Coordination Interventions Activated:  No   Care Coordination Interventions:  No, not indicated    Peter Garter RN, BSN,CCM, [place] Management [phone]

## 2021-12-02 ENCOUNTER — Telehealth: Payer: Self-pay | Admitting: *Deleted

## 2021-12-02 NOTE — Patient Outreach (Signed)
  Care Coordination   12/02/2021 Name: Stephen Bryant MRN: [id number] DOB: [birth year]   Care Coordination Outreach Attempts:  A third unsuccessful outreach was attempted today to offer the patient with information about available care coordination services as a benefit of their health plan.   Follow Up Plan:  No further outreach attempts will be made at this time. We have been unable to contact the patient to offer or enroll patient in care coordination services  Encounter Outcome:  No Answer  Care Coordination Interventions Activated:  No   Care Coordination Interventions:  No, not indicated    Raina Mina, RN Care Management Coordinator [place] Office [phone]

## 2021-12-17 DIAGNOSIS — Z23 Encounter for immunization: Secondary | ICD-10-CM | POA: Diagnosis not present

## 2022-01-29 ENCOUNTER — Other Ambulatory Visit: Payer: Self-pay | Admitting: Family Medicine

## 2022-02-02 DIAGNOSIS — J029 Acute pharyngitis, unspecified: Secondary | ICD-10-CM | POA: Diagnosis not present

## 2022-02-24 ENCOUNTER — Other Ambulatory Visit: Payer: Self-pay | Admitting: Family Medicine

## 2022-02-28 ENCOUNTER — Other Ambulatory Visit: Payer: Self-pay | Admitting: Family Medicine

## 2022-03-10 ENCOUNTER — Ambulatory Visit: Payer: Medicare Other | Admitting: Family Medicine

## 2022-03-10 ENCOUNTER — Encounter: Payer: Self-pay | Admitting: Family Medicine

## 2022-03-10 VITALS — BP 126/83 | HR 78 | Temp 97.9°F | Ht 67.0 in | Wt 189.0 lb

## 2022-03-10 DIAGNOSIS — Z125 Encounter for screening for malignant neoplasm of prostate: Secondary | ICD-10-CM

## 2022-03-10 DIAGNOSIS — R7303 Prediabetes: Secondary | ICD-10-CM

## 2022-03-10 DIAGNOSIS — E7800 Pure hypercholesterolemia, unspecified: Secondary | ICD-10-CM

## 2022-03-10 DIAGNOSIS — Z0000 Encounter for general adult medical examination without abnormal findings: Secondary | ICD-10-CM

## 2022-03-10 LAB — LIPID PANEL
Cholesterol: 195 mg/dL (ref 0–200)
HDL: 71.4 mg/dL (ref >39.00)
LDL Cholesterol: 105 mg/dL — ABNORMAL HIGH (ref 0–99)
NonHDL: 123.93
Total CHOL/HDL Ratio: 3
Triglycerides: 96 mg/dL (ref 0.0–149.0)
VLDL: 19.2 mg/dL (ref 0.0–40.0)

## 2022-03-10 LAB — COMPREHENSIVE METABOLIC PANEL
ALT: 19 U/L (ref 0–53)
AST: 21 U/L (ref 0–37)
Albumin: 4.4 g/dL (ref 3.5–5.2)
Alkaline Phosphatase: 74 U/L (ref 39–117)
BUN: 16 mg/dL (ref 6–23)
CO2: 30 mEq/L (ref 19–32)
Calcium: 9.5 mg/dL (ref 8.4–10.5)
Chloride: 99 mEq/L (ref 96–112)
Creatinine, Ser: 0.96 mg/dL (ref 0.40–1.50)
GFR: 78.89 mL/min (ref >60.00)
Glucose, Bld: 102 mg/dL — ABNORMAL HIGH (ref 70–99)
Potassium: 5.2 mEq/L — ABNORMAL HIGH (ref 3.5–5.1)
Sodium: 138 mEq/L (ref 135–145)
Total Bilirubin: 0.8 mg/dL (ref 0.2–1.2)
Total Protein: 7.4 g/dL (ref 6.0–8.3)

## 2022-03-10 LAB — PSA, MEDICARE: PSA: 4.88 ng/ml — ABNORMAL HIGH (ref 0.10–4.00)

## 2022-03-10 LAB — CBC
HCT: 45.6 % (ref 39.0–52.0)
Hemoglobin: 15 g/dL (ref 13.0–17.0)
MCHC: 32.9 g/dL (ref 30.0–36.0)
MCV: 89.9 fl (ref 78.0–100.0)
Platelets: 378 10*3/uL (ref 150.0–400.0)
RBC: 5.07 Mil/uL (ref 4.22–5.81)
RDW: 14 % (ref 11.5–15.5)
WBC: 7.2 10*3/uL (ref 4.0–10.5)

## 2022-03-10 LAB — HEMOGLOBIN A1C: Hgb A1c MFr Bld: 6.1 % (ref 4.6–6.5)

## 2022-03-10 MED ORDER — IRBESARTAN 75 MG PO TABS: 75.0000 mg | ORAL_TABLET | Freq: Every day | ORAL | 1 refills | Status: DC

## 2022-03-10 MED ORDER — OMEPRAZOLE 40 MG PO CPDR: 40.0000 mg | DELAYED_RELEASE_CAPSULE | Freq: Every day | ORAL | 1 refills | Status: DC

## 2022-03-10 MED ORDER — ATORVASTATIN CALCIUM 20 MG PO TABS: 20.0000 mg | ORAL_TABLET | Freq: Every day | ORAL | 1 refills | Status: DC

## 2022-03-10 NOTE — Patient Instructions (Signed)
Health Maintenance, Male Adopting a healthy lifestyle and getting preventive care are important in promoting health and wellness. Ask your health care provider about: The right schedule for you to have regular tests and exams. Things you can do on your own to prevent diseases and keep yourself healthy. What should I know about diet, weight, and exercise? Eat a healthy diet  Eat a diet that includes plenty of vegetables, fruits, low-fat dairy products, and lean protein. Do not eat a lot of foods that are high in solid fats, added sugars, or sodium. Maintain a healthy weight Body mass index (BMI) is a measurement that can be used to identify possible weight problems. It estimates body fat based on height and weight. Your health care provider can help determine your BMI and help you achieve or maintain a healthy weight. Get regular exercise Get regular exercise. This is one of the most important things you can do for your health. Most adults should: Exercise for at least 150 minutes each week. The exercise should increase your heart rate and make you sweat (moderate-intensity exercise). Do strengthening exercises at least twice a week. This is in addition to the moderate-intensity exercise. Spend less time sitting. Even light physical activity can be beneficial. Watch cholesterol and blood lipids Have your blood tested for lipids and cholesterol at 73 years of age, then have this test every 5 years. You may need to have your cholesterol levels checked more often if: Your lipid or cholesterol levels are high. You are older than 73 years of age. You are at high risk for heart disease. What should I know about cancer screening? Many types of cancers can be detected early and may often be prevented. Depending on your health history and family history, you may need to have cancer screening at various ages. This may include screening for: Colorectal cancer. Prostate cancer. Skin cancer. Lung  cancer. What should I know about heart disease, diabetes, and high blood pressure? Blood pressure and heart disease High blood pressure causes heart disease and increases the risk of stroke. This is more likely to develop in people who have high blood pressure readings or are overweight. Talk with your health care provider about your target blood pressure readings. Have your blood pressure checked: Every 3-5 years if you are 18-39 years of age. Every year if you are 40 years old or older. If you are between the ages of 65 and 75 and are a current or former smoker, ask your health care provider if you should have a one-time screening for abdominal aortic aneurysm (AAA). Diabetes Have regular diabetes screenings. This checks your fasting blood sugar level. Have the screening done: Once every three years after age 45 if you are at a normal weight and have a low risk for diabetes. More often and at a younger age if you are overweight or have a high risk for diabetes. What should I know about preventing infection? Hepatitis B If you have a higher risk for hepatitis B, you should be screened for this virus. Talk with your health care provider to find out if you are at risk for hepatitis B infection. Hepatitis C Blood testing is recommended for: Everyone born from 1945 through 1965. Anyone with known risk factors for hepatitis C. Sexually transmitted infections (STIs) You should be screened each year for STIs, including gonorrhea and chlamydia, if: You are sexually active and are younger than 73 years of age. You are older than 73 years of age and your   health care provider tells you that you are at risk for this type of infection. Your sexual activity has changed since you were last screened, and you are at increased risk for chlamydia or gonorrhea. Ask your health care provider if you are at risk. Ask your health care provider about whether you are at high risk for HIV. Your health care provider  may recommend a prescription medicine to help prevent HIV infection. If you choose to take medicine to prevent HIV, you should first get tested for HIV. You should then be tested every 3 months for as long as you are taking the medicine. Follow these instructions at home: Alcohol use Do not drink alcohol if your health care provider tells you not to drink. If you drink alcohol: Limit how much you have to 0-2 drinks a day. Know how much alcohol is in your drink. In the [place] one drink equals one 12 oz bottle of beer (355 mL), one 5 oz glass of wine (148 mL), or one 1 oz glass of hard liquor (44 mL). Lifestyle Do not use any products that contain nicotine or tobacco. These products include cigarettes, chewing tobacco, and vaping devices, such as e-cigarettes. If you need help quitting, ask your health care provider. Do not use street drugs. Do not share needles. Ask your health care provider for help if you need support or information about quitting drugs. General instructions Schedule regular health, dental, and eye exams. Stay current with your vaccines. Tell your health care provider if: You often feel depressed. You have ever been abused or do not feel safe at home. Summary Adopting a healthy lifestyle and getting preventive care are important in promoting health and wellness. Follow your health care provider's instructions about healthy diet, exercising, and getting tested or screened for diseases. Follow your health care provider's instructions on monitoring your cholesterol and blood pressure. This information is not intended to replace advice given to you by your health care provider. Make sure you discuss any questions you have with your health care provider. Document Revised: 07/08/2020 Document Reviewed: 07/08/2020 Elsevier Patient Education  2023 Elsevier Inc.  

## 2022-03-10 NOTE — Progress Notes (Signed)
Office Note 03/10/2022  CC:  Chief Complaint  Patient presents with   Medical Management of Chronic Issues    Pt is fasting   HPI:  Patient is a 72 y.o. male who is here for annual health maintenance exam and follow-up hypertension, hyperlipidemia, and prediabetes.  Feeling well. His chest discomfort has resolved completely.  Cardiac evaluation was reassuring.  He is compliant with a atorvastatin, irbesartan, and Prilosec daily. He does do some light weight training at home.  He is enjoying his 26 young grandchildren.  Past Medical History:  Diagnosis Date   AVM (arteriovenous malformation) of colon    Diverticulosis of colon (without mention of hemorrhage)    with recurrent diverticulitis   Esophageal stricture    Essential hypertension 2018   Essential hypertension 04/23/2021   GERD (gastroesophageal reflux disease)    Hiatal hernia    Hypercholesterolemia    statin x 1 yr: tolerated med but was taken off it after 1 yr and lipids have been ok as of 2015.  Restarted atorva 06/2016.   Personal history of colonic polyps 04/16/2010; 08/16/19   Recall 2028   Prediabetes    a1c up to 6.4% Dec 2022.  A1c 6.0% 04/2021.   UC (ulcerative colitis) [facility])    sigmoid colon     Past Surgical History:  Procedure Laterality Date   CARDIOVASCULAR STRESS TEST  04/18/2021   Result: "intermediate risk"->cath okay   COLONOSCOPY  2012; 08/16/19   +adenomas->recall 2028.   colonoscopy  with polypectomy  2012; 10/2013   Tubular adenoma: recall 11/2018.   ESOPHAGOGASTRODUODENOSCOPY (EGD) WITH ESOPHAGEAL DILATION  05/2014   LEFT HEART CATH AND CORONARY ANGIOGRAPHY N/A 04/29/2021   Mild-mod LAD disease, no correlation with stress test result  Preserved LVEF, mild elev EDP.  Procedure: LEFT HEART CATH AND CORONARY ANGIOGRAPHY;  Surgeon: Leonie Man, MD;  Location: [facility];  Service: Cardiovascular;  Laterality: N/A;   UPPER GASTROINTESTINAL ENDOSCOPY     VASECTOMY      Family  History  Problem Relation Age of Onset   Stomach cancer Mother    Heart attack Father 31   Diabetes Father    Coronary artery disease Father 100       CABG in his 30s   Heart attack Sister 10   COPD Brother    Diabetes Maternal Grandmother    Colon cancer Neg Hx    Stroke Neg Hx    Asthma Neg Hx    Esophageal cancer Neg Hx    Rectal cancer Neg Hx     Social History   Socioeconomic History   Marital status: Married    Spouse name: Not on file   Number of children: 2   Years of education: Not on file   Highest education level: 12th grade  Occupational History   Occupation: service tech/retired    Employer: Veterinary surgeon.  Tobacco Use   Smoking status: Former    Packs/day: 1.00    Years: 38.00    Total pack years: 38.00    Types: Cigarettes    Quit date: 03/03/2003    Years since quitting: 19.0   Smokeless tobacco: Never   Tobacco comments:    smoked age [age], up to 1 ppd  Vaping Use   Vaping Use: Never used  Substance and Sexual Activity   Alcohol use: Yes    Alcohol/week: 7.0 - 14.0 standard drinks of alcohol    Types: 7 - 14 Standard drinks or equivalent per  week    Comment: 2 glasses of wine everyday or every other day   Drug use: No   Sexual activity: Not on file  Other Topics Concern   Not on file  Social History Narrative   Married, two daughters married.   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   Alc: 1-2 glasses of wine most days.   Tob: quit 2005.  40+  pack-yr hx.   Social Determinants of Health   Financial Resource Strain: Not on file  Food Insecurity: No Food Insecurity (04/16/2021)   Hunger Vital Sign    Worried About Running Out of Food in the Last Year: Never true    Ran Out of Food in the Last Year: Never true  Transportation Needs: No Transportation Needs (04/16/2021)   PRAPARE - Hydrologist (Medical): No    Lack of Transportation (Non-Medical): No  Physical Activity: Insufficiently Active  (04/16/2021)   Exercise Vital Sign    Days of Exercise per Week: 3 days    Minutes of Exercise per Session: 20 min  Stress: No Stress Concern Present (04/16/2021)   [place]    Feeling of Stress : Only a little  Social Connections: Socially Isolated (04/16/2021)   Social Connection and Isolation Panel [NHANES]    Frequency of Communication with Friends and Family: Never    Frequency of Social Gatherings with Friends and Family: Never    Attends Religious Services: Never    Marine scientist or Organizations: No    Attends Music therapist: Not on file    Marital Status: Married  Human resources officer Violence: Not on file    Outpatient Medications Prior to Visit  Medication Sig Dispense Refill   aspirin EC 81 MG tablet Take 1 tablet (81 mg total) by mouth daily. Swallow whole. 90 tablet 3   atorvastatin (LIPITOR) 20 MG tablet TAKE 1 TABLET BY MOUTH EVERY DAY 30 tablet 0   irbesartan (AVAPRO) 75 MG tablet TAKE 1 TABLET BY MOUTH EVERY DAY 30 tablet 0   omeprazole (PRILOSEC) 40 MG capsule TAKE 1 CAPSULE BY MOUTH EVERY DAY 30 capsule 0   No facility-administered medications prior to visit.    No Known Allergies  Review of Systems  Constitutional:  Negative for appetite change, chills, fatigue and fever.  HENT:  Negative for congestion, dental problem, ear pain and sore throat.   Eyes:  Negative for discharge, redness and visual disturbance.  Respiratory:  Negative for cough, chest tightness, shortness of breath and wheezing.   Cardiovascular:  Negative for chest pain, palpitations and leg swelling.  Gastrointestinal:  Negative for abdominal pain, blood in stool, diarrhea, nausea and vomiting.  Genitourinary:  Negative for difficulty urinating, dysuria, flank pain, frequency, hematuria and urgency.  Musculoskeletal:  Negative for arthralgias, back pain, joint swelling, myalgias and neck stiffness.  Skin:   Negative for pallor and rash.  Neurological:  Negative for dizziness, speech difficulty, weakness and headaches.  Hematological:  Negative for adenopathy. Does not bruise/bleed easily.  Psychiatric/Behavioral:  Negative for confusion and sleep disturbance. The patient is not nervous/anxious.    PE;    03/10/2022   10:38 AM 05/08/2021   10:44 AM 04/29/2021   11:15 AM  Vitals with BMI  Height '5\' 7"'$  '5\' 7"'$    Weight 189 lbs 188 lbs 3 oz   BMI 20.25 42.70   Systolic [phone]  Diastolic 83  80 83  Pulse 78 76 68    Gen: Alert, well appearing.  Patient is oriented to person, place, time, and situation. AFFECT: pleasant, lucid thought and speech. ENT: Ears: EACs clear, normal epithelium.  TMs with good light reflex and landmarks bilaterally.  Eyes: no injection, icteris, swelling, or exudate.  EOMI, PERRLA. Nose: no drainage or turbinate edema/swelling.  No injection or focal lesion.  Mouth: lips without lesion/swelling.  Oral mucosa pink and moist.  Dentition intact and without obvious caries or gingival swelling.  Oropharynx without erythema, exudate, or swelling.  Neck: supple/nontender.  No LAD, mass, or TM.  Carotid pulses 2+ bilaterally, without bruits. CV: RRR, no m/r/g.   LUNGS: CTA bilat, nonlabored resps, good aeration in all lung fields. ABD: soft, NT, ND, BS normal.  No hepatospenomegaly or mass.  No bruits. EXT: no clubbing, cyanosis, or edema.  Musculoskeletal: no joint swelling, erythema, warmth, or tenderness.  ROM of all joints intact. Skin - no sores or suspicious lesions or rashes or color changes  Pertinent labs:  Lab Results  Component Value Date   TSH 2.18 07/01/2016   Lab Results  Component Value Date   WBC 6.8 04/16/2021   HGB 14.5 04/16/2021   HCT 44.2 04/16/2021   MCV 88.4 04/16/2021   PLT 301.0 04/16/2021   Lab Results  Component Value Date   CREATININE 0.91 04/23/2021   BUN 13 04/23/2021   NA 141 04/23/2021   K 5.0 04/23/2021   CL 103 04/23/2021    CO2 22 04/23/2021   Lab Results  Component Value Date   ALT 26 02/05/2021   AST 22 02/05/2021   ALKPHOS 76 02/05/2021   BILITOT 1.0 02/05/2021   Lab Results  Component Value Date   CHOL 172 02/05/2021   Lab Results  Component Value Date   HDL 52.40 02/05/2021   Lab Results  Component Value Date   LDLCALC 95 02/05/2021   Lab Results  Component Value Date   TRIG 120.0 02/05/2021   Lab Results  Component Value Date   CHOLHDL 3 02/05/2021   Lab Results  Component Value Date   PSA 1.96 02/05/2021   PSA 1.49 02/22/2020   PSA 1.45 02/08/2019   Lab Results  Component Value Date   HGBA1C 6.0 05/07/2021   ASSESSMENT AND PLAN:   Health maintenance exam: Reviewed age and gender appropriate health maintenance issues (prudent diet, regular exercise, health risks of tobacco and excessive alcohol, use of seatbelts, fire alarms in home, use of sunscreen).  Also reviewed age and gender appropriate health screening as well as vaccine recommendations. Vaccines: Shingrix discussed->he deferred today.  Otherwise ALL UTD. Labs: fasting HP + Hba1c and PSA. Prostate ca screening: PSA. Colon ca screening: Tubular adenoma on 08/16/19 colonoscopy->recall 08/2026.  Hypertension, hyperlipidemia, and GERD all stable. Meds refilled today.  An After Visit Summary was printed and given to the patient.  FOLLOW UP:  Return in about 6 months (around 09/08/2022) for routine chronic illness f/u.  Signed:  Crissie Sickles, MD           03/10/2022

## 2022-03-11 ENCOUNTER — Encounter: Payer: Self-pay | Admitting: Family Medicine

## 2022-03-25 ENCOUNTER — Telehealth: Payer: Self-pay | Admitting: Family Medicine

## 2022-03-25 NOTE — Telephone Encounter (Signed)
Copied from [place] [id number]. Topic: Medicare AWV >> Mar 25, 2022 12:05 PM Gillis Santa wrote: Reason for CRM: LVM PATIENT TO CALL [phone] SCHEDULE AWV WITH TINA TELE VISIT

## 2022-04-09 ENCOUNTER — Telehealth: Payer: Self-pay

## 2022-04-09 NOTE — Telephone Encounter (Signed)
Pt declined sched AWV at this time

## 2022-04-15 DIAGNOSIS — L821 Other seborrheic keratosis: Secondary | ICD-10-CM | POA: Diagnosis not present

## 2022-04-15 DIAGNOSIS — L301 Dyshidrosis [pompholyx]: Secondary | ICD-10-CM | POA: Diagnosis not present

## 2022-04-15 DIAGNOSIS — B353 Tinea pedis: Secondary | ICD-10-CM | POA: Diagnosis not present

## 2022-04-15 DIAGNOSIS — D1801 Hemangioma of skin and subcutaneous tissue: Secondary | ICD-10-CM | POA: Diagnosis not present

## 2022-04-15 DIAGNOSIS — D485 Neoplasm of uncertain behavior of skin: Secondary | ICD-10-CM | POA: Diagnosis not present

## 2022-04-15 DIAGNOSIS — D2272 Melanocytic nevi of left lower limb, including hip: Secondary | ICD-10-CM | POA: Diagnosis not present

## 2022-04-15 DIAGNOSIS — L820 Inflamed seborrheic keratosis: Secondary | ICD-10-CM | POA: Diagnosis not present

## 2022-04-15 DIAGNOSIS — D225 Melanocytic nevi of trunk: Secondary | ICD-10-CM | POA: Diagnosis not present

## 2022-04-15 DIAGNOSIS — L853 Xerosis cutis: Secondary | ICD-10-CM | POA: Diagnosis not present

## 2022-04-20 ENCOUNTER — Telehealth: Payer: Self-pay | Admitting: Family Medicine

## 2022-04-20 NOTE — Telephone Encounter (Signed)
Called patient to schedule Medicare Annual Wellness Visit (AWV). Left message for patient to call back and schedule Medicare Annual Wellness Visit (AWV).  Last date of AWV: 02/22/2020  Please schedule an appointment at any time with Otila Kluver, [facility].  If any questions, please contact me at [phone].  Thank you ,  Shaune Pollack [facility] AWV TEAM Direct Dial [phone]

## 2022-04-27 ENCOUNTER — Telehealth: Payer: Self-pay | Admitting: Family Medicine

## 2022-04-27 NOTE — Telephone Encounter (Signed)
Called patient to schedule Medicare Annual Wellness Visit (AWV). Left message for patient to call back and schedule Medicare Annual Wellness Visit (AWV).  Last date of AWV: 02/22/2020  Please schedule an appointment at any time with Otila Kluver, [facility].  If any questions, please contact me at [phone].  Thank you ,  Shaune Pollack [facility] AWV TEAM Direct Dial [phone]

## 2022-05-20 DIAGNOSIS — L301 Dyshidrosis [pompholyx]: Secondary | ICD-10-CM | POA: Diagnosis not present

## 2022-05-20 DIAGNOSIS — L810 Postinflammatory hyperpigmentation: Secondary | ICD-10-CM | POA: Diagnosis not present

## 2022-05-29 DIAGNOSIS — Z6829 Body mass index (BMI) 29.0-29.9, adult: Secondary | ICD-10-CM | POA: Diagnosis not present

## 2022-05-29 DIAGNOSIS — H6121 Impacted cerumen, right ear: Secondary | ICD-10-CM | POA: Diagnosis not present

## 2022-07-06 ENCOUNTER — Ambulatory Visit (INDEPENDENT_AMBULATORY_CARE_PROVIDER_SITE_OTHER): Payer: Medicare Other | Admitting: Family Medicine

## 2022-07-06 ENCOUNTER — Other Ambulatory Visit: Payer: Self-pay | Admitting: Family Medicine

## 2022-07-06 VITALS — BP 134/71 | HR 79 | Wt 186.0 lb

## 2022-07-06 DIAGNOSIS — L237 Allergic contact dermatitis due to plants, except food: Secondary | ICD-10-CM

## 2022-07-06 MED ORDER — PREDNISONE 10 MG PO TABS: ORAL_TABLET | ORAL | 0 refills | Status: DC

## 2022-07-06 NOTE — Patient Instructions (Signed)
Take 10 mg zyrtec daily as needed for itching

## 2022-07-06 NOTE — Telephone Encounter (Signed)
Pharmacy requested diagnosis code.

## 2022-07-06 NOTE — Progress Notes (Signed)
OFFICE VISIT  07/06/2022  CC:  Chief Complaint  Patient presents with   Rash    Itchy rash that spread to his arms and thigh. He did cut the grass last Thursday and that when he first noticed symptoms.   Patient is a 72 y.o. male who presents for rash.  HPI: Onset 3 d/a itchy bumpy pink rash anterior neck, then spread gradually to involve both arms and both thighs.  A couple little spots on the right upper chest wall.  He feels fine. He mowed the yard the day prior to onset.  He dumps his cut grass in an area where he could have been exposed to poison ivy. He feels fine otherwise.  He has tried topical Benadryl and a couple spots it helps briefly.  Past Medical History:  Diagnosis Date   AVM (arteriovenous malformation) of colon    Diverticulosis of colon (without mention of hemorrhage)    with recurrent diverticulitis   Elevated PSA    03/2021-->urol ref   Esophageal stricture    Essential hypertension 2018   GERD (gastroesophageal reflux disease)    Hiatal hernia    Hypercholesterolemia    statin x 1 yr: tolerated med but was taken off it after 1 yr and lipids have been ok as of 2015.  Restarted atorva 06/2016.   Hyperkalemia    Mild   Personal history of colonic polyps 04/16/2010; 08/16/19   Recall 2028   Plantar fasciitis, left    Prediabetes    a1c up to 6.4% Dec 2022.  A1c 6.0% 04/2021.   UC (ulcerative colitis) [facility])    sigmoid colon     Past Surgical History:  Procedure Laterality Date   CARDIOVASCULAR STRESS TEST  04/18/2021   Result: "intermediate risk"->cath okay   COLONOSCOPY  2012; 08/16/19   +adenomas->recall 2028.   colonoscopy  with polypectomy  2012; 10/2013   Tubular adenoma: recall 11/2018.   ESOPHAGOGASTRODUODENOSCOPY (EGD) WITH ESOPHAGEAL DILATION  05/2014   LEFT HEART CATH AND CORONARY ANGIOGRAPHY N/A 04/29/2021   Mild-mod LAD disease, no correlation with stress test result  Preserved LVEF, mild elev EDP.  Procedure: LEFT HEART CATH AND CORONARY  ANGIOGRAPHY;  Surgeon: Marykay Lex, MD;  Location: [facility] INVASIVE CV LAB;  Service: Cardiovascular;  Laterality: N/A;   UPPER GASTROINTESTINAL ENDOSCOPY     VASECTOMY      Outpatient Medications Prior to Visit  Medication Sig Dispense Refill   atorvastatin (LIPITOR) 20 MG tablet Take 1 tablet (20 mg total) by mouth daily. 90 tablet 1   irbesartan (AVAPRO) 75 MG tablet Take 1 tablet (75 mg total) by mouth daily. 90 tablet 1   omeprazole (PRILOSEC) 40 MG capsule Take 1 capsule (40 mg total) by mouth daily. TAKE 1 CAPSULE BY MOUTH EVERY DAY 90 capsule 1   No facility-administered medications prior to visit.    No Known Allergies  Review of Systems  As per HPI  PE:    07/06/2022    1:20 PM 03/10/2022   10:38 AM 05/08/2021   10:44 AM  Vitals with BMI  Height  5\' 7"  5\' 7"   Weight 186 lbs 189 lbs 188 lbs 3 oz  BMI  29.59 29.47  Systolic 134 126 409  Diastolic 71 83 80  Pulse 79 78 76     Physical Exam  Gen: Alert, well appearing.  Patient is oriented to person, place, time, and situation. He has scattered pinkish papulovesicles under his chin, both arms and both thighs.  LABS:  Last metabolic panel Lab Results  Component Value Date   GLUCOSE 102 (H) 03/10/2022   NA 138 03/10/2022   K 5.2 (H) 03/10/2022   CL 99 03/10/2022   CO2 30 03/10/2022   BUN 16 03/10/2022   CREATININE 0.96 03/10/2022   EGFR 90 04/23/2021   CALCIUM 9.5 03/10/2022   PROT 7.4 03/10/2022   ALBUMIN 4.4 03/10/2022   BILITOT 0.8 03/10/2022   ALKPHOS 74 03/10/2022   AST 21 03/10/2022   ALT 19 03/10/2022   Last hemoglobin A1c Lab Results  Component Value Date   HGBA1C 6.1 03/10/2022   IMPRESSION AND PLAN:  Allergic contact dermatitis suspected due to poison ivy. Prednisone taper: 40 x 3, 30 x 3, 20 x 3, 10 x 3. Zyrtec 10 mg a day as needed for itching. Aloe vera topical for comfort.  An After Visit Summary was printed and given to the patient.  FOLLOW UP: Return if symptoms worsen or fail to  improve.  Signed:  Santiago Bumpers, MD           07/06/2022

## 2022-07-07 ENCOUNTER — Telehealth: Payer: Self-pay | Admitting: Family Medicine

## 2022-07-07 NOTE — Telephone Encounter (Signed)
Patient called to inquire if he can take all 4 predniSONE (DELTASONE) 10 MG tablet  pills at once or does he need to spread them throughout the day? Please give the patient a call to discuss. He called the pharmacy which advised him to call the Dr. Isidore Moos.

## 2022-07-07 NOTE — Telephone Encounter (Signed)
Noted  

## 2022-07-07 NOTE — Telephone Encounter (Unsigned)
Patient called back.  He is aware that he can take all 4 tablets (Prednisone 10mg ) all at once.  Patient does not have any further questions/concerns or concerns at this time.

## 2022-07-07 NOTE — Telephone Encounter (Signed)
Take all 4 at once

## 2022-07-07 NOTE — Telephone Encounter (Signed)
Please Advise

## 2022-07-17 ENCOUNTER — Telehealth: Payer: Self-pay

## 2022-07-17 NOTE — Telephone Encounter (Signed)
Called pt to schedule AWV. Please schedule with health coach or Stephen Bryant.  

## 2022-08-17 DIAGNOSIS — H0011 Chalazion right upper eyelid: Secondary | ICD-10-CM | POA: Diagnosis not present

## 2022-09-03 ENCOUNTER — Other Ambulatory Visit: Payer: Self-pay | Admitting: Family Medicine

## 2022-09-08 ENCOUNTER — Ambulatory Visit: Payer: 59 | Admitting: Family Medicine

## 2022-09-08 DIAGNOSIS — R7303 Prediabetes: Secondary | ICD-10-CM

## 2022-09-08 DIAGNOSIS — I10 Essential (primary) hypertension: Secondary | ICD-10-CM

## 2022-09-08 DIAGNOSIS — E7800 Pure hypercholesterolemia, unspecified: Secondary | ICD-10-CM

## 2022-09-29 ENCOUNTER — Other Ambulatory Visit: Payer: Self-pay | Admitting: Family Medicine

## 2022-10-01 ENCOUNTER — Telehealth: Payer: Self-pay | Admitting: Family Medicine

## 2022-10-01 NOTE — Telephone Encounter (Signed)
Pt will call back to schedule 6 month RCI

## 2022-10-01 NOTE — Telephone Encounter (Signed)
Patient declined to schedule 6 month f/u appt. He would like a call from a CMA to discuss medication management.

## 2022-10-03 ENCOUNTER — Other Ambulatory Visit: Payer: Self-pay | Admitting: Family Medicine

## 2022-10-05 ENCOUNTER — Other Ambulatory Visit: Payer: Self-pay

## 2022-10-05 ENCOUNTER — Ambulatory Visit (INDEPENDENT_AMBULATORY_CARE_PROVIDER_SITE_OTHER): Payer: Medicare Other | Admitting: Family Medicine

## 2022-10-05 ENCOUNTER — Encounter: Payer: Self-pay | Admitting: Family Medicine

## 2022-10-05 VITALS — BP 135/84 | HR 86 | Wt 191.6 lb

## 2022-10-05 DIAGNOSIS — E7800 Pure hypercholesterolemia, unspecified: Secondary | ICD-10-CM

## 2022-10-05 DIAGNOSIS — I10 Essential (primary) hypertension: Secondary | ICD-10-CM

## 2022-10-05 DIAGNOSIS — R972 Elevated prostate specific antigen [PSA]: Secondary | ICD-10-CM

## 2022-10-05 DIAGNOSIS — R7303 Prediabetes: Secondary | ICD-10-CM

## 2022-10-05 MED ORDER — ATORVASTATIN CALCIUM 20 MG PO TABS: 20.0000 mg | ORAL_TABLET | Freq: Every day | ORAL | 1 refills | Status: DC

## 2022-10-05 MED ORDER — OMEPRAZOLE 40 MG PO CPDR: 40.0000 mg | DELAYED_RELEASE_CAPSULE | Freq: Every day | ORAL | 1 refills | Status: DC

## 2022-10-05 MED ORDER — IRBESARTAN 75 MG PO TABS: 75.0000 mg | ORAL_TABLET | Freq: Every day | ORAL | 1 refills | Status: DC

## 2022-10-05 NOTE — Progress Notes (Signed)
OFFICE VISIT  10/05/2022  CC:  Chief Complaint  Patient presents with   Medication Refill    Needs refill on Omeprazole and Atorvastatin     Patient is a 72 y.o. male who presents for 8 month follow-up hypertension, hyperlipidemia, GERD, and prediabetes.  INTERIM HX: He feels well. No home blood pressure monitoring lately.  He is not fasting today.  ROS -> no fevers, no CP, no SOB, no wheezing, no cough, no dizziness, no HAs, no rashes, no melena/hematochezia.  No polyuria or polydipsia.  No myalgias or arthralgias.  No focal weakness, paresthesias, or tremors.  No acute vision or hearing abnormalities.  No dysuria or unusual/new urinary urgency or frequency.  No recent changes in lower legs. No n/v/d or abd pain.  No palpitations.     Past Medical History:  Diagnosis Date   AVM (arteriovenous malformation) of colon    Diverticulosis of colon (without mention of hemorrhage)    with recurrent diverticulitis   Elevated PSA    03/2021-->urol ref   Esophageal stricture    Essential hypertension 2018   GERD (gastroesophageal reflux disease)    Hiatal hernia    Hypercholesterolemia    statin x 1 yr: tolerated med but was taken off it after 1 yr and lipids have been ok as of 2015.  Restarted atorva 06/2016.   Hyperkalemia    Mild   Personal history of colonic polyps 04/16/2010; 08/16/19   Recall 2028   Plantar fasciitis, left    Prediabetes    a1c up to 6.4% Dec 2022.  A1c 6.0% 04/2021.   UC (ulcerative colitis) [facility])    sigmoid colon     Past Surgical History:  Procedure Laterality Date   CARDIOVASCULAR STRESS TEST  04/18/2021   Result: "intermediate risk"->cath okay   COLONOSCOPY  2012; 08/16/19   +adenomas->recall 2028.   colonoscopy  with polypectomy  2012; 10/2013   Tubular adenoma: recall 11/2018.   ESOPHAGOGASTRODUODENOSCOPY (EGD) WITH ESOPHAGEAL DILATION  05/2014   LEFT HEART CATH AND CORONARY ANGIOGRAPHY N/A 04/29/2021   Mild-mod LAD disease, no correlation with  stress test result  Preserved LVEF, mild elev EDP.  Procedure: LEFT HEART CATH AND CORONARY ANGIOGRAPHY;  Surgeon: Marykay Lex, MD;  Location: [facility] INVASIVE CV LAB;  Service: Cardiovascular;  Laterality: N/A;   UPPER GASTROINTESTINAL ENDOSCOPY     VASECTOMY      Outpatient Medications Prior to Visit  Medication Sig Dispense Refill   atorvastatin (LIPITOR) 20 MG tablet TAKE 1 TABLET BY MOUTH EVERY DAY 30 tablet 0   irbesartan (AVAPRO) 75 MG tablet TAKE 1 TABLET BY MOUTH EVERY DAY 30 tablet 0   omeprazole (PRILOSEC) 40 MG capsule TAKE 1 CAPSULE (40 MG TOTAL) BY MOUTH DAILY. TAKE 1 CAPSULE BY MOUTH EVERY DAY 30 capsule 0   predniSONE (DELTASONE) 10 MG tablet 4 PO QD X 3D, THEN 3 PO QD X 3D, THEN 2 PO QD X 3D, THEN 1 PO QD X 3D (Patient not taking: Reported on 10/05/2022) 30 tablet 0   No facility-administered medications prior to visit.    No Known Allergies  Review of Systems As per HPI  PE:    10/05/2022   10:41 AM 07/06/2022    1:20 PM 03/10/2022   10:38 AM  Vitals with BMI  Height   5\' 7"   Weight 191 lbs 10 oz 186 lbs 189 lbs  BMI   29.59  Systolic 135 134 366  Diastolic 84 71 83  Pulse 86 79  78     Physical Exam  Gen: Alert, well appearing.  Patient is oriented to person, place, time, and situation. AFFECT: pleasant, lucid thought and speech. No further exam today  LABS:  Last CBC Lab Results  Component Value Date   WBC 7.2 03/10/2022   HGB 15.0 03/10/2022   HCT 45.6 03/10/2022   MCV 89.9 03/10/2022   RDW 14.0 03/10/2022   PLT 378.0 03/10/2022   Last metabolic panel Lab Results  Component Value Date   GLUCOSE 102 (H) 03/10/2022   NA 138 03/10/2022   K 5.2 (H) 03/10/2022   CL 99 03/10/2022   CO2 30 03/10/2022   BUN 16 03/10/2022   CREATININE 0.96 03/10/2022   GFR 78.89 03/10/2022   CALCIUM 9.5 03/10/2022   PROT 7.4 03/10/2022   ALBUMIN 4.4 03/10/2022   BILITOT 0.8 03/10/2022   ALKPHOS 74 03/10/2022   AST 21 03/10/2022   ALT 19 03/10/2022   Last  lipids Lab Results  Component Value Date   CHOL 195 03/10/2022   HDL 71.40 03/10/2022   LDLCALC 105 (H) 03/10/2022   LDLDIRECT 201.5 01/27/2012   TRIG 96.0 03/10/2022   CHOLHDL 3 03/10/2022   Last hemoglobin A1c Lab Results  Component Value Date   HGBA1C 6.1 03/10/2022   Last thyroid functions Lab Results  Component Value Date   TSH 2.18 07/01/2016   Last vitamin B12 and Folate Lab Results  Component Value Date   VITAMINB12 1093 (H) 03/07/2010   FOLATE 16.7 03/07/2010   Lab Results  Component Value Date   PSA 4.88 (H) 03/10/2022   PSA 1.96 02/05/2021   PSA 1.49 02/22/2020   IMPRESSION AND PLAN:  #1 hypertension, well-controlled on irbesartan 75 mg a day. Electrolytes and creatinine today.  2.  Prediabetes. Monitor hemoglobin A1c today.  3.  #3 hyperlipidemia, doing well on atorvastatin 20 mg a day. Repeat fasting lipids in 6 months.  #4 elevated PSA. Repeating PSA today.  An After Visit Summary was printed and given to the patient.  FOLLOW UP: Return in about 6 months (around 04/07/2023) for annual CPE (fasting).  Signed:  Santiago Bumpers, MD           10/05/2022

## 2022-10-05 NOTE — Telephone Encounter (Signed)
Pt has appt today, will address refills at that time

## 2022-10-05 NOTE — Addendum Note (Signed)
Addended by: Emi Holes D on: 10/05/2022 11:17 AM   Modules accepted: Orders

## 2022-10-13 ENCOUNTER — Other Ambulatory Visit (INDEPENDENT_AMBULATORY_CARE_PROVIDER_SITE_OTHER): Payer: Medicare Other

## 2022-10-13 DIAGNOSIS — R972 Elevated prostate specific antigen [PSA]: Secondary | ICD-10-CM | POA: Diagnosis not present

## 2022-10-13 DIAGNOSIS — R7303 Prediabetes: Secondary | ICD-10-CM

## 2022-10-13 DIAGNOSIS — I10 Essential (primary) hypertension: Secondary | ICD-10-CM

## 2022-10-13 LAB — BASIC METABOLIC PANEL
BUN: 15 mg/dL (ref 6–23)
CO2: 27 mEq/L (ref 19–32)
Calcium: 9.2 mg/dL (ref 8.4–10.5)
Chloride: 99 mEq/L (ref 96–112)
Creatinine, Ser: 0.92 mg/dL (ref 0.40–1.50)
GFR: 82.68 mL/min (ref >60.00)
Glucose, Bld: 114 mg/dL — ABNORMAL HIGH (ref 70–99)
Potassium: 4.2 mEq/L (ref 3.5–5.1)
Sodium: 135 mEq/L (ref 135–145)

## 2022-10-13 LAB — PSA, MEDICARE: PSA: 2.45 ng/ml (ref 0.10–4.00)

## 2022-10-13 LAB — HEMOGLOBIN A1C: Hgb A1c MFr Bld: 6 % (ref 4.6–6.5)

## 2022-10-13 NOTE — Progress Notes (Signed)
Pt came for labs only, tolerated draw well.   

## 2023-03-31 ENCOUNTER — Other Ambulatory Visit: Payer: Self-pay | Admitting: Family Medicine

## 2023-04-07 ENCOUNTER — Encounter: Payer: Self-pay | Admitting: Family Medicine

## 2023-04-07 ENCOUNTER — Ambulatory Visit: Payer: Medicare Other | Admitting: Family Medicine

## 2023-04-07 VITALS — BP 137/87 | HR 81 | Temp 97.7°F | Ht 68.5 in | Wt 199.6 lb

## 2023-04-07 DIAGNOSIS — R7303 Prediabetes: Secondary | ICD-10-CM | POA: Diagnosis not present

## 2023-04-07 DIAGNOSIS — R972 Elevated prostate specific antigen [PSA]: Secondary | ICD-10-CM | POA: Diagnosis not present

## 2023-04-07 DIAGNOSIS — Z0000 Encounter for general adult medical examination without abnormal findings: Secondary | ICD-10-CM

## 2023-04-07 DIAGNOSIS — I10 Essential (primary) hypertension: Secondary | ICD-10-CM

## 2023-04-07 DIAGNOSIS — E7800 Pure hypercholesterolemia, unspecified: Secondary | ICD-10-CM

## 2023-04-07 DIAGNOSIS — Z125 Encounter for screening for malignant neoplasm of prostate: Secondary | ICD-10-CM | POA: Diagnosis not present

## 2023-04-07 LAB — COMPREHENSIVE METABOLIC PANEL
ALT: 22 U/L (ref 0–53)
AST: 22 U/L (ref 0–37)
Albumin: 4.4 g/dL (ref 3.5–5.2)
Alkaline Phosphatase: 68 U/L (ref 39–117)
BUN: 15 mg/dL (ref 6–23)
CO2: 30 meq/L (ref 19–32)
Calcium: 9.1 mg/dL (ref 8.4–10.5)
Chloride: 100 meq/L (ref 96–112)
Creatinine, Ser: 0.96 mg/dL (ref 0.40–1.50)
GFR: 78.3 mL/min (ref >60.00)
Glucose, Bld: 105 mg/dL — ABNORMAL HIGH (ref 70–99)
Potassium: 4.8 meq/L (ref 3.5–5.1)
Sodium: 137 meq/L (ref 135–145)
Total Bilirubin: 0.8 mg/dL (ref 0.2–1.2)
Total Protein: 7 g/dL (ref 6.0–8.3)

## 2023-04-07 LAB — HEMOGLOBIN A1C: Hgb A1c MFr Bld: 6.2 % (ref 4.6–6.5)

## 2023-04-07 LAB — LIPID PANEL
Cholesterol: 177 mg/dL (ref 0–200)
HDL: 69.6 mg/dL (ref >39.00)
LDL Cholesterol: 92 mg/dL (ref 0–99)
NonHDL: 107.4
Total CHOL/HDL Ratio: 3
Triglycerides: 75 mg/dL (ref 0.0–149.0)
VLDL: 15 mg/dL (ref 0.0–40.0)

## 2023-04-07 LAB — CBC
HCT: 44.1 % (ref 39.0–52.0)
Hemoglobin: 14.5 g/dL (ref 13.0–17.0)
MCHC: 32.9 g/dL (ref 30.0–36.0)
MCV: 90.8 fL (ref 78.0–100.0)
Platelets: 302 10*3/uL (ref 150.0–400.0)
RBC: 4.85 Mil/uL (ref 4.22–5.81)
RDW: 13.8 % (ref 11.5–15.5)
WBC: 8 10*3/uL (ref 4.0–10.5)

## 2023-04-07 LAB — PSA, MEDICARE: PSA: 2.35 ng/mL (ref 0.10–4.00)

## 2023-04-07 MED ORDER — ATORVASTATIN CALCIUM 20 MG PO TABS: 20.0000 mg | ORAL_TABLET | Freq: Every day | ORAL | 1 refills | Status: DC

## 2023-04-07 MED ORDER — OMEPRAZOLE 40 MG PO CPDR: 40.0000 mg | DELAYED_RELEASE_CAPSULE | Freq: Every day | ORAL | 1 refills | Status: DC

## 2023-04-07 MED ORDER — IRBESARTAN 75 MG PO TABS: 75.0000 mg | ORAL_TABLET | Freq: Every day | ORAL | 1 refills | Status: DC

## 2023-04-07 NOTE — Patient Instructions (Signed)
 Health Maintenance, Male  Adopting a healthy lifestyle and getting preventive care are important in promoting health and wellness. Ask your health care provider about:  The right schedule for you to have regular tests and exams.  Things you can do on your own to prevent diseases and keep yourself healthy.  What should I know about diet, weight, and exercise?  Eat a healthy diet    Eat a diet that includes plenty of vegetables, fruits, low-fat dairy products, and lean protein.  Do not eat a lot of foods that are high in solid fats, added sugars, or sodium.  Maintain a healthy weight  Body mass index (BMI) is a measurement that can be used to identify possible weight problems. It estimates body fat based on height and weight. Your health care provider can help determine your BMI and help you achieve or maintain a healthy weight.  Get regular exercise  Get regular exercise. This is one of the most important things you can do for your health. Most adults should:  Exercise for at least 150 minutes each week. The exercise should increase your heart rate and make you sweat (moderate-intensity exercise).  Do strengthening exercises at least twice a week. This is in addition to the moderate-intensity exercise.  Spend less time sitting. Even light physical activity can be beneficial.  Watch cholesterol and blood lipids  Have your blood tested for lipids and cholesterol at 74 years of age, then have this test every 5 years.  You may need to have your cholesterol levels checked more often if:  Your lipid or cholesterol levels are high.  You are older than 74 years of age.  You are at high risk for heart disease.  What should I know about cancer screening?  Many types of cancers can be detected early and may often be prevented. Depending on your health history and family history, you may need to have cancer screening at various ages. This may include screening for:  Colorectal cancer.  Prostate cancer.  Skin cancer.  Lung  cancer.  What should I know about heart disease, diabetes, and high blood pressure?  Blood pressure and heart disease  High blood pressure causes heart disease and increases the risk of stroke. This is more likely to develop in people who have high blood pressure readings or are overweight.  Talk with your health care provider about your target blood pressure readings.  Have your blood pressure checked:  Every 3-5 years if you are 24-52 years of age.  Every year if you are 3 years old or older.  If you are between the ages of 60 and 72 and are a current or former smoker, ask your health care provider if you should have a one-time screening for abdominal aortic aneurysm (AAA).  Diabetes  Have regular diabetes screenings. This checks your fasting blood sugar level. Have the screening done:  Once every three years after age 66 if you are at a normal weight and have a low risk for diabetes.  More often and at a younger age if you are overweight or have a high risk for diabetes.  What should I know about preventing infection?  Hepatitis B  If you have a higher risk for hepatitis B, you should be screened for this virus. Talk with your health care provider to find out if you are at risk for hepatitis B infection.  Hepatitis C  Blood testing is recommended for:  Everyone born from [birth year] through 1966.  Anyone  with known risk factors for hepatitis C.  Sexually transmitted infections (STIs)  You should be screened each year for STIs, including gonorrhea and chlamydia, if:  You are sexually active and are younger than 74 years of age.  You are older than 74 years of age and your health care provider tells you that you are at risk for this type of infection.  Your sexual activity has changed since you were last screened, and you are at increased risk for chlamydia or gonorrhea. Ask your health care provider if you are at risk.  Ask your health care provider about whether you are at high risk for HIV. Your health care provider  may recommend a prescription medicine to help prevent HIV infection. If you choose to take medicine to prevent HIV, you should first get tested for HIV. You should then be tested every 3 months for as long as you are taking the medicine.  Follow these instructions at home:  Alcohol use  Do not drink alcohol if your health care provider tells you not to drink.  If you drink alcohol:  Limit how much you have to 0-2 drinks a day.  Know how much alcohol is in your drink. In the [place] one drink equals one 12 oz bottle of beer (355 mL), one 5 oz glass of wine (148 mL), or one 1 oz glass of hard liquor (44 mL).  Lifestyle  Do not use any products that contain nicotine or tobacco. These products include cigarettes, chewing tobacco, and vaping devices, such as e-cigarettes. If you need help quitting, ask your health care provider.  Do not use street drugs.  Do not share needles.  Ask your health care provider for help if you need support or information about quitting drugs.  General instructions  Schedule regular health, dental, and eye exams.  Stay current with your vaccines.  Tell your health care provider if:  You often feel depressed.  You have ever been abused or do not feel safe at home.  Summary  Adopting a healthy lifestyle and getting preventive care are important in promoting health and wellness.  Follow your health care provider's instructions about healthy diet, exercising, and getting tested or screened for diseases.  Follow your health care provider's instructions on monitoring your cholesterol and blood pressure.  This information is not intended to replace advice given to you by your health care provider. Make sure you discuss any questions you have with your health care provider.  Document Revised: 07/08/2020 Document Reviewed: 07/08/2020  Elsevier Patient Education  2024 [employer].

## 2023-04-07 NOTE — Progress Notes (Signed)
 Office Note 04/07/2023  CC:  Chief Complaint  Patient presents with   Annual Exam    Pt is fasting   Patient is a 73 y.o. male who is here for annual health maintenance exam and 85-month follow-up hypertension, prediabetes, hypercholesterolemia, and history of elevated PSA. A/P as of last visit: #1 hypertension, well-controlled on irbesartan  75 mg a day. Electrolytes and creatinine today.   2.  Prediabetes. Monitor hemoglobin A1c today.   3. hyperlipidemia, doing well on atorvastatin  20 mg a day. Repeat fasting lipids in 6 months.   #4 elevated PSA. Repeating PSA today.  INTERIM HX: PSA decreased significantly last check.  All other labs excellent as well.  Stephen Bryant feels well. Admits that he has not had as good of diet and exercise habits the last 3 months or so.  Home blood pressure monitoring shows measurements consistently less than 130/80.  Past Medical History:  Diagnosis Date   AVM (arteriovenous malformation) of colon    Diverticulosis of colon (without mention of hemorrhage)    with recurrent diverticulitis   Elevated PSA    03/2021-->urol ref   Esophageal stricture    Essential hypertension 2018   GERD (gastroesophageal reflux disease)    Hiatal hernia    Hypercholesterolemia    statin x 1 yr: tolerated med but was taken off it after 1 yr and lipids have been ok as of 2015.  Restarted atorva 06/2016.   Hyperkalemia    Mild   Personal history of colonic polyps 04/16/2010; 08/16/19   Recall 2028   Plantar fasciitis, left    Prediabetes    a1c up to 6.4% Dec 2022.  A1c 6.0% 04/2021.   UC (ulcerative colitis) [facility])    sigmoid colon     Past Surgical History:  Procedure Laterality Date   CARDIOVASCULAR STRESS TEST  04/18/2021   Result: intermediate risk->cath okay   COLONOSCOPY  2012; 08/16/19   +adenomas->recall 2028.   colonoscopy  with polypectomy  2012; 10/2013   Tubular adenoma: recall 11/2018.   ESOPHAGOGASTRODUODENOSCOPY (EGD) WITH ESOPHAGEAL  DILATION  05/2014   LEFT HEART CATH AND CORONARY ANGIOGRAPHY N/A 04/29/2021   Mild-mod LAD disease, no correlation with stress test result  Preserved LVEF, mild elev EDP.  Procedure: LEFT HEART CATH AND CORONARY ANGIOGRAPHY;  Surgeon: Anner Alm ORN, MD;  Location: [facility] INVASIVE CV LAB;  Service: Cardiovascular;  Laterality: N/A;   UPPER GASTROINTESTINAL ENDOSCOPY     VASECTOMY      Family History  Problem Relation Age of Onset   Stomach cancer Mother    Heart attack Father 40   Diabetes Father    Coronary artery disease Father 1       CABG in his 83s   Heart attack Sister 33   COPD Brother    Diabetes Maternal Grandmother    Colon cancer Neg Hx    Stroke Neg Hx    Asthma Neg Hx    Esophageal cancer Neg Hx    Rectal cancer Neg Hx     Social History   Socioeconomic History   Marital status: Married    Spouse name: Not on file   Number of children: 2   Years of education: Not on file   Highest education level: Associate degree: occupational, scientist, product/process development, or vocational program  Occupational History   Occupation: service tech/retired    Employer: [employer].  Tobacco Use   Smoking status: Former    Current packs/day: 0.00    Average packs/day: 1 pack/day for  38.0 years (38.0 ttl pk-yrs)    Types: Cigarettes    Start date: 03/02/1965    Quit date: 03/03/2003    Years since quitting: 20.1   Smokeless tobacco: Never   Tobacco comments:    smoked age [age], up to 1 ppd  Vaping Use   Vaping status: Never Used  Substance and Sexual Activity   Alcohol use: Yes    Alcohol/week: 7.0 - 14.0 standard drinks of alcohol    Types: 7 - 14 Standard drinks or equivalent per week    Comment: 2 glasses of wine everyday or every other day   Drug use: No   Sexual activity: Not on file  Other Topics Concern   Not on file  Social History Narrative   Married, two daughters married.   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   Alc: 1-2 glasses of wine most days.    Tob: quit 2005.  40+  pack-yr hx.   Social Drivers of Corporate Investment Banker Strain: High Risk (04/03/2023)   Overall Financial Resource Strain (CARDIA)    Difficulty of Paying Living Expenses: Hard  Food Insecurity: No Food Insecurity (04/03/2023)   Hunger Vital Sign    Worried About Running Out of Food in the Last Year: Never true    Ran Out of Food in the Last Year: Never true  Transportation Needs: No Transportation Needs (04/03/2023)   PRAPARE - Administrator, Civil Service (Medical): No    Lack of Transportation (Non-Medical): No  Physical Activity: Inactive (04/03/2023)   Exercise Vital Sign    Days of Exercise per Week: 0 days    Minutes of Exercise per Session: 20 min  Stress: No Stress Concern Present (04/03/2023)   [employer] of Occupational Health - Occupational Stress Questionnaire    Feeling of Stress : Not at all  Social Connections: Socially Isolated (04/03/2023)   Social Connection and Isolation Panel [NHANES]    Frequency of Communication with Friends and Family: Once a week    Frequency of Social Gatherings with Friends and Family: Once a week    Attends Religious Services: Never    Database Administrator or Organizations: No    Attends Engineer, Structural: Not on file    Marital Status: Married  Catering Manager Violence: Not on file    Outpatient Medications Prior to Visit  Medication Sig Dispense Refill   atorvastatin  (LIPITOR) 20 MG tablet Take 1 tablet (20 mg total) by mouth daily. 90 tablet 1   irbesartan  (AVAPRO ) 75 MG tablet Take 1 tablet (75 mg total) by mouth daily. 90 tablet 1   omeprazole  (PRILOSEC) 40 MG capsule Take 1 capsule (40 mg total) by mouth daily. TAKE 1 CAPSULE BY MOUTH EVERY DAY 90 capsule 1   No facility-administered medications prior to visit.    No Known Allergies  Review of Systems  Constitutional:  Negative for appetite change, chills, fatigue and fever.  HENT:  Negative for congestion, dental problem,  ear pain and sore throat.   Eyes:  Negative for discharge, redness and visual disturbance.  Respiratory:  Negative for cough, chest tightness, shortness of breath and wheezing.   Cardiovascular:  Negative for chest pain, palpitations and leg swelling.  Gastrointestinal:  Negative for abdominal pain, blood in stool, diarrhea, nausea and vomiting.  Genitourinary:  Negative for difficulty urinating, dysuria, flank pain, frequency, hematuria and urgency.  Musculoskeletal:  Negative for arthralgias, back pain, joint swelling, myalgias  and neck stiffness.  Skin:  Negative for pallor and rash.  Neurological:  Negative for dizziness, speech difficulty, weakness and headaches.  Hematological:  Negative for adenopathy. Does not bruise/bleed easily.  Psychiatric/Behavioral:  Negative for confusion and sleep disturbance. The patient is not nervous/anxious.     PE;    04/07/2023    8:24 AM 10/05/2022   10:41 AM 07/06/2022    1:20 PM  Vitals with BMI  Height 5' 8.504    Weight 199 lbs 10 oz 191 lbs 10 oz 186 lbs  BMI 29.9    Systolic 137 135 865  Diastolic 87 84 71  Pulse 81 86 79    Gen: Alert, well appearing.  Patient is oriented to person, place, time, and situation. AFFECT: pleasant, lucid thought and speech. ENT: Ears: EACs clear, normal epithelium.  TMs with good light reflex and landmarks bilaterally.  Eyes: no injection, icteris, swelling, or exudate.  EOMI, PERRLA. Nose: no drainage or turbinate edema/swelling.  No injection or focal lesion.  Mouth: lips without lesion/swelling.  Oral mucosa pink and moist.  Dentition intact and without obvious caries or gingival swelling.  Oropharynx without erythema, exudate, or swelling.  Neck: supple/nontender.  No LAD, mass, or TM.  Carotid pulses 2+ bilaterally, without bruits. CV: RRR, no m/r/g.   LUNGS: CTA bilat, nonlabored resps, good aeration in all lung fields. ABD: soft, NT, ND, BS normal.  No hepatospenomegaly or mass.  No bruits. EXT: no  clubbing, cyanosis, or edema.  Musculoskeletal: no joint swelling, erythema, warmth, or tenderness.  ROM of all joints intact. Skin - no sores or suspicious lesions or rashes or color changes  Pertinent labs:  Lab Results  Component Value Date   TSH 2.18 07/01/2016   Lab Results  Component Value Date   WBC 7.2 03/10/2022   HGB 15.0 03/10/2022   HCT 45.6 03/10/2022   MCV 89.9 03/10/2022   PLT 378.0 03/10/2022   Lab Results  Component Value Date   CREATININE 0.92 10/13/2022   BUN 15 10/13/2022   NA 135 10/13/2022   K 4.2 10/13/2022   CL 99 10/13/2022   CO2 27 10/13/2022   Lab Results  Component Value Date   ALT 19 03/10/2022   AST 21 03/10/2022   ALKPHOS 74 03/10/2022   BILITOT 0.8 03/10/2022   Lab Results  Component Value Date   CHOL 195 03/10/2022   Lab Results  Component Value Date   HDL 71.40 03/10/2022   Lab Results  Component Value Date   LDLCALC 105 (H) 03/10/2022   Lab Results  Component Value Date   TRIG 96.0 03/10/2022   Lab Results  Component Value Date   CHOLHDL 3 03/10/2022   Lab Results  Component Value Date   PSA 2.45 10/13/2022   PSA 4.88 (H) 03/10/2022   PSA 1.96 02/05/2021   Lab Results  Component Value Date   HGBA1C 6.0 10/13/2022   ASSESSMENT AND PLAN:   #1 health maintenance exam: Reviewed age and gender appropriate health maintenance issues (prudent diet, regular exercise, health risks of tobacco and excessive alcohol, use of seatbelts, fire alarms in home, use of sunscreen).  Also reviewed age and gender appropriate health screening as well as vaccine recommendations. Vaccines:Flu-->declined.  Otherwise ALL UTD. Labs: fasting HP + Hba1c and PSA. Prostate ca screening: PSA. Colon ca screening: Tubular adenoma on 08/16/19 colonoscopy->recall 08/2026.  2 Hypertension, well-controlled on irbesartan  75 mg a day. Electrolytes and creatinine today.   3.  Prediabetes. Monitor hemoglobin  A1c today.   3. hyperlipidemia, doing well  on atorvastatin  20 mg a day. Lipid panel and hepatic panel today.   #4 elevated PSA.  PSA 4.88 in January 2024. This came down to 2.45 in August 2024. Repeating PSA today.  If stable then we will resume annual PSA checks or consider discontinuing them altogether.  An After Visit Summary was printed and given to the patient.  FOLLOW UP:  No follow-ups on file.  Signed:  Gerlene Hockey, MD           04/07/2023

## 2023-04-12 DIAGNOSIS — Z23 Encounter for immunization: Secondary | ICD-10-CM | POA: Diagnosis not present

## 2023-05-27 ENCOUNTER — Telehealth: Payer: Self-pay

## 2023-05-27 NOTE — Telephone Encounter (Signed)
 Copied from CRM [id number]. Topic: General - Other >> May 26, 2023  2:20 PM Alcus Dad wrote: Reason for CRM: Patient got bill from Feb. And patient does not understand bill. Patient called Billing department and didn't get a proper answer. Medicare did not cover any part of the visit. Patient wants to speak to Dr. Milinda Cave to discuss bill and to see if he can afford to continue to see him every 6 months   Please review and assist, if possible.

## 2023-06-02 NOTE — Telephone Encounter (Signed)
 Noted.

## 2023-06-28 DIAGNOSIS — D225 Melanocytic nevi of trunk: Secondary | ICD-10-CM | POA: Diagnosis not present

## 2023-06-28 DIAGNOSIS — D485 Neoplasm of uncertain behavior of skin: Secondary | ICD-10-CM | POA: Diagnosis not present

## 2023-06-28 DIAGNOSIS — B353 Tinea pedis: Secondary | ICD-10-CM | POA: Diagnosis not present

## 2023-06-28 DIAGNOSIS — D1801 Hemangioma of skin and subcutaneous tissue: Secondary | ICD-10-CM | POA: Diagnosis not present

## 2023-06-28 DIAGNOSIS — L821 Other seborrheic keratosis: Secondary | ICD-10-CM | POA: Diagnosis not present

## 2023-07-06 ENCOUNTER — Other Ambulatory Visit: Payer: Self-pay | Admitting: Family Medicine

## 2023-09-30 ENCOUNTER — Other Ambulatory Visit: Payer: Self-pay | Admitting: Family Medicine

## 2023-10-01 NOTE — Patient Instructions (Signed)

## 2023-10-05 ENCOUNTER — Ambulatory Visit (INDEPENDENT_AMBULATORY_CARE_PROVIDER_SITE_OTHER): Payer: Medicare Other | Admitting: Family Medicine

## 2023-10-05 ENCOUNTER — Encounter: Payer: Self-pay | Admitting: Family Medicine

## 2023-10-05 VITALS — BP 124/82 | HR 88 | Temp 98.3°F | Ht 68.5 in | Wt 196.2 lb

## 2023-10-05 DIAGNOSIS — E785 Hyperlipidemia, unspecified: Secondary | ICD-10-CM

## 2023-10-05 DIAGNOSIS — R7303 Prediabetes: Secondary | ICD-10-CM

## 2023-10-05 DIAGNOSIS — I10 Essential (primary) hypertension: Secondary | ICD-10-CM | POA: Diagnosis not present

## 2023-10-05 DIAGNOSIS — Z79899 Other long term (current) drug therapy: Secondary | ICD-10-CM | POA: Diagnosis not present

## 2023-10-05 LAB — COMPREHENSIVE METABOLIC PANEL WITH GFR
ALT: 18 U/L (ref 0–53)
AST: 17 U/L (ref 0–37)
Albumin: 4.4 g/dL (ref 3.5–5.2)
Alkaline Phosphatase: 68 U/L (ref 39–117)
BUN: 15 mg/dL (ref 6–23)
CO2: 29 meq/L (ref 19–32)
Calcium: 9.4 mg/dL (ref 8.4–10.5)
Chloride: 101 meq/L (ref 96–112)
Creatinine, Ser: 0.94 mg/dL (ref 0.40–1.50)
GFR: 80.02 mL/min (ref >60.00)
Glucose, Bld: 107 mg/dL — ABNORMAL HIGH (ref 70–99)
Potassium: 4.5 meq/L (ref 3.5–5.1)
Sodium: 136 meq/L (ref 135–145)
Total Bilirubin: 1.3 mg/dL — ABNORMAL HIGH (ref 0.2–1.2)
Total Protein: 7.1 g/dL (ref 6.0–8.3)

## 2023-10-05 LAB — LIPID PANEL
Cholesterol: 173 mg/dL (ref 0–200)
HDL: 66.6 mg/dL (ref >39.00)
LDL Cholesterol: 81 mg/dL (ref 0–99)
NonHDL: 106.74
Total CHOL/HDL Ratio: 3
Triglycerides: 128 mg/dL (ref 0.0–149.0)
VLDL: 25.6 mg/dL (ref 0.0–40.0)

## 2023-10-05 LAB — VITAMIN B12: Vitamin B-12: 378 pg/mL (ref 211–911)

## 2023-10-05 LAB — HEMOGLOBIN A1C: Hgb A1c MFr Bld: 6.4 % (ref 4.6–6.5)

## 2023-10-05 MED ORDER — IRBESARTAN 75 MG PO TABS: 75.0000 mg | ORAL_TABLET | Freq: Every day | ORAL | 1 refills | Status: DC

## 2023-10-05 MED ORDER — ATORVASTATIN CALCIUM 20 MG PO TABS: 20.0000 mg | ORAL_TABLET | Freq: Every day | ORAL | 1 refills | Status: DC

## 2023-10-05 MED ORDER — OMEPRAZOLE 40 MG PO CPDR: 40.0000 mg | DELAYED_RELEASE_CAPSULE | Freq: Every day | ORAL | 1 refills | Status: DC

## 2023-10-05 NOTE — Progress Notes (Signed)
 OFFICE VISIT  10/05/2023  CC:  Chief Complaint  Patient presents with   Medical Management of Chronic Issues    Pt is fasting    Patient is a 73 y.o. male who presents for 12-month follow-up hypertension, prediabetes, hypercholesterolemia, and history of elevated PSA. A/P as of last visit: 1 Hypertension, well-controlled on irbesartan  75 mg a day. Electrolytes and creatinine today.   2.  Prediabetes. Monitor hemoglobin A1c today.   3. hyperlipidemia, doing well on atorvastatin  20 mg a day. Lipid panel and hepatic panel today.   #4 elevated PSA.  PSA 4.88 in January 2024. This came down to 2.45 in August 2024. Repeating PSA today.  If stable then we will resume annual PSA checks or consider discontinuing them altogether.  INTERIM HX: Lauren is feeling very well. No acute concerns. He remains very active but no formal exercise lately.  ROS --> no fevers, no CP, no SOB, no wheezing, no cough, no dizziness, no HAs, no rashes, no melena/hematochezia.  No polyuria or polydipsia.  No myalgias or arthralgias.  No focal weakness, paresthesias, or tremors.  No acute vision or hearing abnormalities.  No dysuria or unusual/new urinary urgency or frequency.  No recent changes in lower legs. No n/v/d or abd pain.  No palpitations.    Past Medical History:  Diagnosis Date   AVM (arteriovenous malformation) of colon    Diverticulosis of colon (without mention of hemorrhage)    with recurrent diverticulitis   Elevated PSA    03/2021-->urol ref   Esophageal stricture    Essential hypertension 2018   GERD (gastroesophageal reflux disease)    Hiatal hernia    Hypercholesterolemia    statin x 1 yr: tolerated med but was taken off it after 1 yr and lipids have been ok as of 2015.  Restarted atorva 06/2016.   Hyperkalemia    Mild   Personal history of colonic polyps 04/16/2010; 08/16/19   Recall 2028   Plantar fasciitis, left    Prediabetes    a1c up to 6.4% Dec 2022.  A1c 6.0% 04/2021.    UC (ulcerative colitis) [facility])    sigmoid colon     Past Surgical History:  Procedure Laterality Date   CARDIOVASCULAR STRESS TEST  04/18/2021   Result: intermediate risk->cath okay   COLONOSCOPY  2012; 08/16/19   +adenomas->recall 2028.   colonoscopy  with polypectomy  2012; 10/2013   Tubular adenoma: recall 11/2018.   ESOPHAGOGASTRODUODENOSCOPY (EGD) WITH ESOPHAGEAL DILATION  05/2014   LEFT HEART CATH AND CORONARY ANGIOGRAPHY N/A 04/29/2021   Mild-mod LAD disease, no correlation with stress test result  Preserved LVEF, mild elev EDP.  Procedure: LEFT HEART CATH AND CORONARY ANGIOGRAPHY;  Surgeon: Anner Alm ORN, MD;  Location: [facility] INVASIVE CV LAB;  Service: Cardiovascular;  Laterality: N/A;   UPPER GASTROINTESTINAL ENDOSCOPY     VASECTOMY      Outpatient Medications Prior to Visit  Medication Sig Dispense Refill   atorvastatin  (LIPITOR) 20 MG tablet TAKE 1 TABLET BY MOUTH EVERY DAY 90 tablet 0   irbesartan  (AVAPRO ) 75 MG tablet Take 1 tablet (75 mg total) by mouth daily. 90 tablet 1   omeprazole  (PRILOSEC) 40 MG capsule Take 1 capsule (40 mg total) by mouth daily. 90 capsule 1   No facility-administered medications prior to visit.    No Known Allergies  Review of Systems As per HPI  PE:    10/05/2023    8:17 AM 04/07/2023    8:24 AM 10/05/2022  10:41 AM  Vitals with BMI  Height 5' 8.5 5' 8.504   Weight 196 lbs 3 oz 199 lbs 10 oz 191 lbs 10 oz  BMI 29.39 29.9   Systolic 124 137 864  Diastolic 82 87 84  Pulse 88 81 86    Physical Exam  Gen: Alert, well appearing.  Patient is oriented to person, place, time, and situation. AFFECT: pleasant, lucid thought and speech. CV: RRR, no m/r/g.   LUNGS: CTA bilat, nonlabored resps, good aeration in all lung fields. EXT: no clubbing or cyanosis.  no edema.    LABS:  Last CBC Lab Results  Component Value Date   WBC 8.0 04/07/2023   HGB 14.5 04/07/2023   HCT 44.1 04/07/2023   MCV 90.8 04/07/2023   RDW 13.8 04/07/2023    PLT 302.0 04/07/2023   Last metabolic panel Lab Results  Component Value Date   GLUCOSE 105 (H) 04/07/2023   NA 137 04/07/2023   K 4.8 04/07/2023   CL 100 04/07/2023   CO2 30 04/07/2023   BUN 15 04/07/2023   CREATININE 0.96 04/07/2023   GFR 78.30 04/07/2023   CALCIUM  9.1 04/07/2023   PROT 7.0 04/07/2023   ALBUMIN 4.4 04/07/2023   BILITOT 0.8 04/07/2023   ALKPHOS 68 04/07/2023   AST 22 04/07/2023   ALT 22 04/07/2023   Last lipids Lab Results  Component Value Date   CHOL 177 04/07/2023   HDL 69.60 04/07/2023   LDLCALC 92 04/07/2023   LDLDIRECT 201.5 01/27/2012   TRIG 75.0 04/07/2023   CHOLHDL 3 04/07/2023   Last hemoglobin A1c Lab Results  Component Value Date   HGBA1C 6.2 04/07/2023   Last thyroid  functions Lab Results  Component Value Date   TSH 2.18 07/01/2016   Last vitamin B12 and Folate Lab Results  Component Value Date   VITAMINB12 1093 (H) 03/07/2010   FOLATE 16.7 03/07/2010   Lab Results  Component Value Date   PSA 2.35 04/07/2023   PSA 2.45 10/13/2022   PSA 4.88 (H) 03/10/2022   IMPRESSION AND PLAN:  1 Hypertension, well-controlled on irbesartan  75 mg a day. Electrolytes and creatinine today.   2.  Prediabetes. Monitor hemoglobin A1c today.   3. hyperlipidemia, doing well on atorvastatin  20 mg a day. Lipid panel and hepatic panel today.  #4 GERD, history of long-term PPI use. Monitor B12 today.  5 elevated PSA.  PSA 4.88 in January 2024. This came down to 2.45 in August 2024. Down to 2.35 Feb 2025. Potentially rpt PSA Feb 2026 vs no further screening.  An After Visit Summary was printed and given to the patient.  FOLLOW UP: Return in about 6 months (around 04/06/2024) for routine chronic illness f/u.  Signed:  Gerlene Hockey, MD           10/05/2023

## 2023-10-07 ENCOUNTER — Ambulatory Visit: Payer: Self-pay | Admitting: Family Medicine

## 2023-10-08 ENCOUNTER — Encounter: Payer: Self-pay | Admitting: Family Medicine

## 2024-01-07 ENCOUNTER — Other Ambulatory Visit (INDEPENDENT_AMBULATORY_CARE_PROVIDER_SITE_OTHER)

## 2024-01-07 DIAGNOSIS — R7303 Prediabetes: Secondary | ICD-10-CM

## 2024-01-08 LAB — HEMOGLOBIN A1C
Hgb A1c MFr Bld: 6 % — ABNORMAL HIGH (ref <5.7)
Mean Plasma Glucose: 126 mg/dL
eAG (mmol/L): 7 mmol/L

## 2024-01-16 ENCOUNTER — Ambulatory Visit: Payer: Self-pay | Admitting: Family Medicine

## 2024-01-31 DIAGNOSIS — Z23 Encounter for immunization: Secondary | ICD-10-CM | POA: Diagnosis not present

## 2024-02-29 ENCOUNTER — Telehealth: Payer: Self-pay | Admitting: Gastroenterology

## 2024-02-29 NOTE — Telephone Encounter (Signed)
 Patient called requesting to know if he is able to have refill for dicyclomine . Stating he has a episode over the weekend with abdominal cramping and took his last doses of medication. Requesting medication for when he has another episode. Advised patient he may have to have an office visit due to not being seen in 2 years. Please advise, thank you.

## 2024-02-29 NOTE — Telephone Encounter (Signed)
 Yes he needs to schedule an office visit before we can even think about giving him a refill - its actually been 3 years.

## 2024-03-08 NOTE — Telephone Encounter (Signed)
 Left patient a voicemail to advise to schedule an office visit for med refill

## 2024-03-10 MED ORDER — OMEPRAZOLE 40 MG PO CPDR: 40.0000 mg | DELAYED_RELEASE_CAPSULE | Freq: Every day | ORAL | 1 refills | Status: AC

## 2024-03-10 NOTE — Telephone Encounter (Signed)
 Omeprazole  refilled.

## 2024-03-10 NOTE — Telephone Encounter (Signed)
 PT has been scheduled for 1/22

## 2024-03-19 NOTE — Progress Notes (Unsigned)
 "     Ellouise Console, PA-C [address] [place], [place]  [address] Phone: [phone]   Primary Care Physician: Candise Aleene DEL, MD  Primary Gastroenterologist:  Ellouise Console, PA-C / Glendia Holt, MD   Chief Complaint: Follow-up Colon Spasm.  Refill Dicyclomine .   HPI:   Discussed the use of AI scribe software for clinical note transcription with the patient, who gave verbal consent to proceed.  Previous patient of Dr. Aneita.  Patient is requesting to transfer care to Dr. Holt, who cares for his wife.  Last saw Dr. Aneita in our office 04/2021.  He was previously on dicyclomine  10 mg 3 times daily as needed for colon spasm.  He only took this medicine approximately twice per year when he had a flareup of lower abdominal cramping which was infrequent.  He is out of this medication and is requesting a refill.  He denies any side effects to dicyclomine .  History of Present Illness Stephen Bryant is a 74 year old male with gastroesophageal reflux disease and episodic abdominal cramping who presents for medication refills.  Abdominal Cramping and Colon Spasm: - Requests refill of dicyclomine , previously prescribed for episodic abdominal cramping and colon spasm - Uses dicyclomine  infrequently, typically twice a year during episodes of abdominal pain - Most recent episode occurred one month ago after eating cabbage rolls, lasted two days, and he used his last two pills at that time - Currently out of dicyclomine  and prefers to have it available for future episodes, stating it helps him tremendously. - No current abdominal pain  Gastroesophageal Reflux Symptoms: - Continues omeprazole  for acid reflux, with refills managed by primary care provider - Experiences occasional symptoms of acid reflux but is otherwise stable on omeprazole  - Last upper endoscopy in 2016 showed esophagitis from acid reflux and esophageal dilation was performed - No current or recent  dysphagia  Colorectal Polyp Surveillance: - Last colonoscopy in June 2021 revealed one small precancerous polyp, which was removed - Aware of recommendation for repeat colonoscopy in seven years (June 2028) - No new gastrointestinal complaints or concerns  General Status: - No other current problems or concerns - Feels he has been doing well lately  EGD 05/2014:  LA Grade B esophagitis.  Esophageal stricture dilated.  Small hiatal hernia.   08/2019 last colonoscopy: One 8 mm tubular adenoma polyp removed from transverse colon.  Multiple nonbleeding colon angiodysplastic lesions.  Moderate left-sided diverticulosis.  Internal hemorrhoids.  7-year repeat colonoscopy will be due 08/2026.    Current Outpatient Medications  Medication Sig Dispense Refill   atorvastatin  (LIPITOR) 20 MG tablet Take 1 tablet (20 mg total) by mouth daily. 90 tablet 1   dicyclomine  (BENTYL ) 10 MG capsule Take 1 capsule (10 mg total) by mouth 3 (three) times daily as needed for spasms. 30 capsule 2   irbesartan  (AVAPRO ) 75 MG tablet Take 1 tablet (75 mg total) by mouth daily. 90 tablet 1   omeprazole  (PRILOSEC) 40 MG capsule Take 1 capsule (40 mg total) by mouth daily. 90 capsule 1   No current facility-administered medications for this visit.    Allergies as of 03/23/2024   (No Known Allergies)    Past Medical History:  Diagnosis Date   AVM (arteriovenous malformation) of colon    Diverticulosis of colon (without mention of hemorrhage)    with recurrent diverticulitis   Elevated PSA    03/2021-->urol ref   Esophageal stricture    Essential hypertension 2018   GERD (  gastroesophageal reflux disease)    Hiatal hernia    Hypercholesterolemia    statin x 1 yr: tolerated med but was taken off it after 1 yr and lipids have been ok as of 2015.  Restarted atorva 06/2016.   Hyperkalemia    Mild   Personal history of colonic polyps 04/16/2010; 08/16/19   Recall 2028   Plantar fasciitis, left    Prediabetes     a1c up to 6.4% Dec 2022.  A1c 6.0% 04/2021.   UC (ulcerative colitis) [facility])    sigmoid colon     Past Surgical History:  Procedure Laterality Date   CARDIOVASCULAR STRESS TEST  04/18/2021   Result: intermediate risk->cath okay   COLONOSCOPY  2012; 08/16/19   +adenomas->recall 2028.   colonoscopy  with polypectomy  2012; 10/2013   Tubular adenoma: recall 11/2018.   ESOPHAGOGASTRODUODENOSCOPY (EGD) WITH ESOPHAGEAL DILATION  05/2014   LEFT HEART CATH AND CORONARY ANGIOGRAPHY N/A 04/29/2021   Mild-mod LAD disease, no correlation with stress test result  Preserved LVEF, mild elev EDP.  Procedure: LEFT HEART CATH AND CORONARY ANGIOGRAPHY;  Surgeon: Anner Alm ORN, MD;  Location: [facility] INVASIVE CV LAB;  Service: Cardiovascular;  Laterality: N/A;   UPPER GASTROINTESTINAL ENDOSCOPY     VASECTOMY      Review of Systems:    All systems reviewed and negative except where noted in HPI.    Physical Exam:  BP 126/80   Pulse 85   Ht 5' 8.5 (1.74 m)   Wt 194 lb (88 kg)   BMI 29.07 kg/m  No LMP for male patient.  General: Well-nourished, well-developed in no acute distress.  Lungs: Clear to auscultation bilaterally. Non-labored. Heart: Regular rate and rhythm, no murmurs rubs or gallops.  Abdomen: Bowel sounds are normal; Abdomen is Soft; No hepatosplenomegaly, masses or hernias;  No Abdominal Tenderness; No guarding or rebound tenderness. Neuro: Alert and oriented x 3.  Grossly intact.  Psych: Alert and cooperative, normal mood and affect.   Imaging Studies: No results found.  Labs: CBC    Component Value Date/Time   WBC 8.0 04/07/2023 0839   RBC 4.85 04/07/2023 0839   HGB 14.5 04/07/2023 0839   HCT 44.1 04/07/2023 0839   PLT 302.0 04/07/2023 0839   MCV 90.8 04/07/2023 0839   MCHC 32.9 04/07/2023 0839   RDW 13.8 04/07/2023 0839   LYMPHSABS 2.4 04/16/2021 1158   MONOABS 0.8 04/16/2021 1158   EOSABS 0.2 04/16/2021 1158   BASOSABS 0.1 04/16/2021 1158    CMP     Component  Value Date/Time   NA 136 10/05/2023 0835   NA 141 04/23/2021 1130   K 4.5 10/05/2023 0835   CL 101 10/05/2023 0835   CO2 29 10/05/2023 0835   GLUCOSE 107 (H) 10/05/2023 0835   BUN 15 10/05/2023 0835   BUN 13 04/23/2021 1130   CREATININE 0.94 10/05/2023 0835   CREATININE 1.01 08/07/2016 1546   CALCIUM  9.4 10/05/2023 0835   PROT 7.1 10/05/2023 0835   ALBUMIN 4.4 10/05/2023 0835   AST 17 10/05/2023 0835   ALT 18 10/05/2023 0835   ALKPHOS 68 10/05/2023 0835   BILITOT 1.3 (H) 10/05/2023 0835       Assessment and Plan:   DINH AYOTTE is a 74 y.o. y/o male presents for follow-up of:  1.  Colon spasm Experiences infrequent abdominal cramping and colonic spasm, currently asymptomatic. Infrequent dicyclomine  use not concerning. Reviewed potential anticholinergic side effects. - Refilled dicyclomine  10 mg 3 times daily  as needed, #30, 2 refills. - Counseled on anticholinergic side effects: xerostomia, blurred vision, dizziness, somnolence.  He will let us  know if he develops any adverse side effects.  2.  GERD with esophagitis: Controlled on PPI - Okay for PCP to continue to fill omeprazole  40 mg daily.  3.  History of esophageal stricture: Asymptomatic. - If he has recurrent solid food dysphagia, then he will follow-up with us .  4.  History of adenomatous colon polyp - 7-year repeat colonoscopy will be due 08/2026.   Ellouise Console, PA-C  Follow up as needed.   "

## 2024-03-23 ENCOUNTER — Encounter: Payer: Self-pay | Admitting: Physician Assistant

## 2024-03-23 ENCOUNTER — Ambulatory Visit: Admitting: Physician Assistant

## 2024-03-23 VITALS — BP 126/80 | HR 85 | Ht 68.5 in | Wt 194.0 lb

## 2024-03-23 DIAGNOSIS — K589 Irritable bowel syndrome without diarrhea: Secondary | ICD-10-CM | POA: Diagnosis not present

## 2024-03-23 DIAGNOSIS — Z8719 Personal history of other diseases of the digestive system: Secondary | ICD-10-CM

## 2024-03-23 DIAGNOSIS — K2100 Gastro-esophageal reflux disease with esophagitis, without bleeding: Secondary | ICD-10-CM

## 2024-03-23 DIAGNOSIS — Z860101 Personal history of adenomatous and serrated colon polyps: Secondary | ICD-10-CM

## 2024-03-23 DIAGNOSIS — K219 Gastro-esophageal reflux disease without esophagitis: Secondary | ICD-10-CM

## 2024-03-23 DIAGNOSIS — R109 Unspecified abdominal pain: Secondary | ICD-10-CM

## 2024-03-23 MED ORDER — DICYCLOMINE HCL 10 MG PO CAPS: 10.0000 mg | ORAL_CAPSULE | Freq: Three times a day (TID) | ORAL | 2 refills | Status: AC | PRN

## 2024-03-23 NOTE — Patient Instructions (Signed)
 We have sent the following medications to your pharmacy for you to pick up at your convenience: Dicyclomine  10 mg, take 1 tablet 3 times a day as needed for pain.  Follow up as needed.  Thank you for trusting me with your gastrointestinal care!   Ellouise Console, PA-C  _______________________________________________________  If your blood pressure at your visit was 140/90 or greater, please contact your primary care physician to follow up on this.  _______________________________________________________  If you are age 75 or older, your body mass index should be between 23-30. Your Body mass index is 29.07 kg/m. If this is out of the aforementioned range listed, please consider follow up with your Primary Care Provider.  If you are age 75 or younger, your body mass index should be between 19-25. Your Body mass index is 29.07 kg/m. If this is out of the aformentioned range listed, please consider follow up with your Primary Care Provider.   ________________________________________________________  The [facility] providers would like to encourage you to use MYCHART to communicate with providers for non-urgent requests or questions.  Due to long hold times on the telephone, sending your provider a message by [facility] may be a faster and more efficient way to get a response.  Please allow 48 business hours for a response.  Please remember that this is for non-urgent requests.  _______________________________________________________  [facility] is using a team-based approach to care.  Your team is made up of your doctor and two to three APPS. Our APPS (Nurse Practitioners and Physician Assistants) work with your physician to ensure care continuity for you. They are fully qualified to address your health concerns and develop a treatment plan. They communicate directly with your gastroenterologist to care for you. Seeing the Advanced Practice Practitioners on your physician's team can  help you by facilitating care more promptly, often allowing for earlier appointments, access to diagnostic testing, procedures, and other specialty referrals.

## 2024-03-26 NOTE — Progress Notes (Signed)
 Agree with the assessment and plan as outlined by Brigitte Canard, PA-C.

## 2024-04-05 ENCOUNTER — Encounter: Payer: Self-pay | Admitting: Family Medicine

## 2024-04-06 ENCOUNTER — Encounter: Payer: Self-pay | Admitting: Family Medicine

## 2024-04-06 ENCOUNTER — Ambulatory Visit: Admitting: Family Medicine

## 2024-04-06 VITALS — BP 119/75 | HR 73 | Temp 97.8°F | Ht 68.5 in | Wt 191.6 lb

## 2024-04-06 DIAGNOSIS — Z5181 Encounter for therapeutic drug level monitoring: Secondary | ICD-10-CM

## 2024-04-06 DIAGNOSIS — E782 Mixed hyperlipidemia: Secondary | ICD-10-CM

## 2024-04-06 DIAGNOSIS — R7303 Prediabetes: Secondary | ICD-10-CM

## 2024-04-06 DIAGNOSIS — I10 Essential (primary) hypertension: Secondary | ICD-10-CM

## 2024-04-06 LAB — BASIC METABOLIC PANEL WITH GFR
BUN: 17 mg/dL (ref 6–23)
CO2: 30 meq/L (ref 19–32)
Calcium: 9.6 mg/dL (ref 8.4–10.5)
Chloride: 102 meq/L (ref 96–112)
Creatinine, Ser: 1.01 mg/dL (ref 0.40–1.50)
GFR: 73.16 mL/min
Glucose, Bld: 112 mg/dL — ABNORMAL HIGH (ref 70–99)
Potassium: 4.6 meq/L (ref 3.5–5.1)
Sodium: 139 meq/L (ref 135–145)

## 2024-04-06 LAB — LIPID PANEL
Cholesterol: 155 mg/dL (ref 28–200)
HDL: 60.9 mg/dL
LDL Cholesterol: 78 mg/dL (ref 10–99)
NonHDL: 94.26
Total CHOL/HDL Ratio: 3
Triglycerides: 82 mg/dL (ref 10.0–149.0)
VLDL: 16.4 mg/dL (ref 0.0–40.0)

## 2024-04-06 LAB — VITAMIN B12: Vitamin B-12: 461 pg/mL (ref 211–911)

## 2024-04-06 LAB — HEMOGLOBIN A1C: Hgb A1c MFr Bld: 6.1 % (ref 4.6–6.5)

## 2024-04-06 MED ORDER — IRBESARTAN 75 MG PO TABS: 75.0000 mg | ORAL_TABLET | Freq: Every day | ORAL | 3 refills | Status: AC

## 2024-04-06 MED ORDER — ATORVASTATIN CALCIUM 20 MG PO TABS: 20.0000 mg | ORAL_TABLET | Freq: Every day | ORAL | 3 refills | Status: AC

## 2024-04-06 NOTE — Progress Notes (Signed)
 OFFICE VISIT  04/06/2024  CC:  Chief Complaint  Patient presents with   Medical Management of Chronic Issues    Pt is fasting    Patient is a 74 y.o. male who presents for 74-month follow-up hypertension, prediabetes, hypercholesterolemia, encounter for monitoring long-term PPI use, and history of elevated PSA. A/P as of last visit: Hypertension, well-controlled on irbesartan  75 mg a day. Electrolytes and creatinine today.   2.  Prediabetes. Monitor hemoglobin A1c today.   3. hyperlipidemia, doing well on atorvastatin  20 mg a day. Lipid panel and hepatic panel today.   #4 GERD, history of long-term PPI use. Monitor B12 today.   5 elevated PSA.  PSA 4.88 in January 2024. This came down to 2.45 in August 2024. Down to 2.35 Feb 2025. Potentially rpt PSA Feb 2026 vs no further screening.  INTERIM HX: Feeling well.  Occasional home blood pressure monitoring is normal.  ROS as above, plus--> no fevers, no CP, no SOB, no wheezing, no cough, no dizziness, no HAs, no rashes, no melena/hematochezia.  No polyuria or polydipsia.  No myalgias or arthralgias.  No focal weakness, paresthesias, or tremors.  No acute vision or hearing abnormalities.  No dysuria or unusual/new urinary urgency or frequency.  No recent changes in lower legs. No n/v/d or abd pain.  No palpitations.     Past Medical History:  Diagnosis Date   AVM (arteriovenous malformation) of colon    Diverticulosis of colon (without mention of hemorrhage)    with recurrent diverticulitis   Elevated PSA    03/2021-->urol ref   Esophageal stricture    Essential hypertension 2018   GERD (gastroesophageal reflux disease)    Hiatal hernia    Hypercholesterolemia    statin x 1 yr: tolerated med but was taken off it after 1 yr and lipids have been ok as of 2015.  Restarted atorva 06/2016.   Hyperkalemia    Mild   Personal history of colonic polyps 04/16/2010; 08/16/19   Recall 2028   Plantar fasciitis, left    Prediabetes     a1c up to 6.4% Dec 2022.  A1c 6.0% 04/2021.   UC (ulcerative colitis) [facility])    sigmoid colon     Past Surgical History:  Procedure Laterality Date   CARDIOVASCULAR STRESS TEST  04/18/2021   Result: intermediate risk->cath okay   COLONOSCOPY  2012; 08/16/19   +adenomas->recall 2028.   colonoscopy  with polypectomy  2012; 10/2013   Tubular adenoma: recall 11/2018.   ESOPHAGOGASTRODUODENOSCOPY (EGD) WITH ESOPHAGEAL DILATION  05/2014   LEFT HEART CATH AND CORONARY ANGIOGRAPHY N/A 04/29/2021   Mild-mod LAD disease, no correlation with stress test result  Preserved LVEF, mild elev EDP.  Procedure: LEFT HEART CATH AND CORONARY ANGIOGRAPHY;  Surgeon: Anner Alm ORN, MD;  Location: [facility] INVASIVE CV LAB;  Service: Cardiovascular;  Laterality: N/A;   UPPER GASTROINTESTINAL ENDOSCOPY     VASECTOMY      Outpatient Medications Prior to Visit  Medication Sig Dispense Refill   atorvastatin  (LIPITOR) 20 MG tablet Take 1 tablet (20 mg total) by mouth daily. 90 tablet 1   dicyclomine  (BENTYL ) 10 MG capsule Take 1 capsule (10 mg total) by mouth 3 (three) times daily as needed for spasms. 30 capsule 2   irbesartan  (AVAPRO ) 75 MG tablet Take 1 tablet (75 mg total) by mouth daily. 90 tablet 1   omeprazole  (PRILOSEC) 40 MG capsule Take 1 capsule (40 mg total) by mouth daily. 90 capsule 1   No facility-administered  medications prior to visit.   Allergies[1]  Review of Systems As per HPI  PE:    04/06/2024    8:50 AM 03/23/2024    3:13 PM 10/05/2023    8:17 AM  Vitals with BMI  Height 5' 8.5 5' 8.5 5' 8.5  Weight 191 lbs 10 oz 194 lbs 196 lbs 3 oz  BMI 28.71 29.07 29.39  Systolic 119 126 875  Diastolic 75 80 82  Pulse 73 85 88     Physical Exam  Gen: Alert, well appearing.  Patient is oriented to person, place, time, and situation. CV: RRR, no m/r/g.   LUNGS: CTA bilat, nonlabored resps, good aeration in all lung fields. EXT: no edema  LABS:  Last CBC Lab Results  Component Value  Date   WBC 8.0 04/07/2023   HGB 14.5 04/07/2023   HCT 44.1 04/07/2023   MCV 90.8 04/07/2023   RDW 13.8 04/07/2023   PLT 302.0 04/07/2023   Lab Results  Component Value Date   IRON 106 03/07/2010   IRON 106 03/07/2010   FERRITIN 290.7 03/07/2010   Last metabolic panel Lab Results  Component Value Date   GLUCOSE 107 (H) 10/05/2023   NA 136 10/05/2023   K 4.5 10/05/2023   CL 101 10/05/2023   CO2 29 10/05/2023   BUN 15 10/05/2023   CREATININE 0.94 10/05/2023   GFR 80.02 10/05/2023   CALCIUM  9.4 10/05/2023   PROT 7.1 10/05/2023   ALBUMIN 4.4 10/05/2023   BILITOT 1.3 (H) 10/05/2023   ALKPHOS 68 10/05/2023   AST 17 10/05/2023   ALT 18 10/05/2023   Last lipids Lab Results  Component Value Date   CHOL 173 10/05/2023   HDL 66.60 10/05/2023   LDLCALC 81 10/05/2023   LDLDIRECT 201.5 01/27/2012   TRIG 128.0 10/05/2023   CHOLHDL 3 10/05/2023   Last hemoglobin A1c Lab Results  Component Value Date   HGBA1C 6.0 (H) 01/07/2024   Last vitamin B12 and Folate Lab Results  Component Value Date   VITAMINB12 378 10/05/2023   FOLATE 16.7 03/07/2010   IMPRESSION AND PLAN:  #1 Hypertension, well-controlled on irbesartan  75 mg a day. Electrolytes and creatinine today.   2.  Prediabetes. Monitor hemoglobin A1c and fasting glucose today.   3. hyperlipidemia, doing well on atorvastatin  20 mg a day. Lipid panel today.   #4 GERD, requires long-term PPI use. Monitor B12 today.   5 elevated PSA.  PSA 4.88 in January 2024. This came down to 2.45 in August 2024. Down to 2.35 Feb 2025. Through shared decision making process today we decided to do no further PSA monitoring.  An After Visit Summary was printed and given to the patient.  FOLLOW UP: No follow-ups on file.  Signed:  Phil Aldeen Riga, MD           04/06/2024     [1] No Known Allergies

## 2024-04-07 ENCOUNTER — Ambulatory Visit: Payer: Self-pay | Admitting: Family Medicine

## 2024-09-06 ENCOUNTER — Ambulatory Visit: Admitting: Family Medicine
